# Patient Record
Sex: Male | Born: 1935 | Race: White | Hispanic: No | Smoking: Current every day smoker
Health system: Southern US, Community
[De-identification: ages and names within clinical notes are randomized; demographics above are authoritative.]

## PROBLEM LIST (undated history)

## (undated) DIAGNOSIS — I714 Abdominal aortic aneurysm, without rupture, unspecified: Secondary | ICD-10-CM

## (undated) DIAGNOSIS — Z9889 Other specified postprocedural states: Secondary | ICD-10-CM

## (undated) DIAGNOSIS — I10 Essential (primary) hypertension: Secondary | ICD-10-CM

## (undated) DIAGNOSIS — C649 Malignant neoplasm of unspecified kidney, except renal pelvis: Secondary | ICD-10-CM

## (undated) DIAGNOSIS — J449 Chronic obstructive pulmonary disease, unspecified: Secondary | ICD-10-CM

## (undated) DIAGNOSIS — I679 Cerebrovascular disease, unspecified: Secondary | ICD-10-CM

## (undated) DIAGNOSIS — I2510 Atherosclerotic heart disease of native coronary artery without angina pectoris: Secondary | ICD-10-CM

## (undated) DIAGNOSIS — I724 Aneurysm of artery of lower extremity: Secondary | ICD-10-CM

## (undated) DIAGNOSIS — I739 Peripheral vascular disease, unspecified: Secondary | ICD-10-CM

## (undated) DIAGNOSIS — E785 Hyperlipidemia, unspecified: Secondary | ICD-10-CM

## (undated) HISTORY — DX: Other specified postprocedural states: Z98.890

## (undated) HISTORY — DX: Atherosclerotic heart disease of native coronary artery without angina pectoris: I25.10

## (undated) HISTORY — DX: Essential (primary) hypertension: I10

## (undated) HISTORY — PX: OTHER SURGICAL HISTORY: SHX169

## (undated) HISTORY — DX: Chronic obstructive pulmonary disease, unspecified: J44.9

## (undated) HISTORY — DX: Cerebrovascular disease, unspecified: I67.9

## (undated) HISTORY — DX: Hyperlipidemia, unspecified: E78.5

## (undated) HISTORY — DX: Peripheral vascular disease, unspecified: I73.9

## (undated) HISTORY — DX: Malignant neoplasm of unspecified kidney, except renal pelvis: C64.9

---

## 1998-10-03 HISTORY — PX: ABDOMINAL AORTIC ANEURYSM REPAIR: SUR1152

## 1999-08-16 ENCOUNTER — Encounter: Payer: Self-pay | Admitting: *Deleted

## 1999-08-17 ENCOUNTER — Ambulatory Visit: Admission: RE | Admit: 1999-08-17 | Discharge: 1999-08-17 | Payer: Self-pay | Admitting: *Deleted

## 1999-08-20 ENCOUNTER — Ambulatory Visit (HOSPITAL_COMMUNITY): Admission: RE | Admit: 1999-08-20 | Discharge: 1999-08-20 | Payer: Self-pay | Admitting: *Deleted

## 1999-08-20 ENCOUNTER — Encounter: Payer: Self-pay | Admitting: *Deleted

## 1999-09-13 ENCOUNTER — Encounter: Payer: Self-pay | Admitting: *Deleted

## 1999-09-13 ENCOUNTER — Inpatient Hospital Stay (HOSPITAL_COMMUNITY): Admission: RE | Admit: 1999-09-13 | Discharge: 1999-09-21 | Payer: Self-pay | Admitting: *Deleted

## 1999-09-14 ENCOUNTER — Encounter: Payer: Self-pay | Admitting: *Deleted

## 1999-09-16 ENCOUNTER — Encounter: Payer: Self-pay | Admitting: *Deleted

## 1999-09-20 ENCOUNTER — Encounter: Payer: Self-pay | Admitting: *Deleted

## 2005-02-10 ENCOUNTER — Ambulatory Visit: Payer: Self-pay | Admitting: Oncology

## 2005-07-29 ENCOUNTER — Ambulatory Visit: Payer: Self-pay | Admitting: Oncology

## 2005-11-04 ENCOUNTER — Ambulatory Visit: Payer: Self-pay | Admitting: Oncology

## 2006-01-03 ENCOUNTER — Observation Stay (HOSPITAL_COMMUNITY): Admission: EM | Admit: 2006-01-03 | Discharge: 2006-01-04 | Payer: Self-pay | Admitting: Emergency Medicine

## 2006-01-03 ENCOUNTER — Ambulatory Visit: Payer: Self-pay | Admitting: *Deleted

## 2006-01-09 ENCOUNTER — Ambulatory Visit: Payer: Self-pay

## 2006-01-09 ENCOUNTER — Ambulatory Visit: Payer: Self-pay | Admitting: Cardiology

## 2006-02-15 ENCOUNTER — Ambulatory Visit: Payer: Self-pay | Admitting: Cardiology

## 2006-03-07 ENCOUNTER — Ambulatory Visit: Payer: Self-pay

## 2006-04-12 ENCOUNTER — Ambulatory Visit: Payer: Self-pay | Admitting: Cardiology

## 2006-04-21 ENCOUNTER — Ambulatory Visit: Payer: Self-pay | Admitting: Oncology

## 2006-05-04 ENCOUNTER — Ambulatory Visit: Payer: Self-pay | Admitting: Cardiology

## 2006-08-14 ENCOUNTER — Ambulatory Visit: Payer: Self-pay | Admitting: Cardiology

## 2006-10-09 ENCOUNTER — Ambulatory Visit: Payer: Self-pay | Admitting: Oncology

## 2006-12-19 ENCOUNTER — Ambulatory Visit: Payer: Self-pay | Admitting: Cardiology

## 2007-03-20 ENCOUNTER — Ambulatory Visit: Payer: Self-pay | Admitting: Cardiology

## 2007-03-20 ENCOUNTER — Ambulatory Visit: Payer: Self-pay

## 2007-03-26 ENCOUNTER — Ambulatory Visit (HOSPITAL_COMMUNITY): Admission: RE | Admit: 2007-03-26 | Discharge: 2007-03-26 | Payer: Self-pay | Admitting: Cardiology

## 2007-03-26 ENCOUNTER — Ambulatory Visit: Payer: Self-pay | Admitting: Cardiology

## 2007-04-02 ENCOUNTER — Ambulatory Visit: Payer: Self-pay | Admitting: Cardiology

## 2007-07-11 ENCOUNTER — Ambulatory Visit: Payer: Self-pay | Admitting: Cardiology

## 2007-09-19 ENCOUNTER — Ambulatory Visit: Payer: Self-pay | Admitting: Cardiology

## 2008-01-09 ENCOUNTER — Ambulatory Visit: Payer: Self-pay | Admitting: Cardiovascular Disease

## 2008-01-22 ENCOUNTER — Ambulatory Visit: Payer: Self-pay | Admitting: Cardiology

## 2008-04-15 ENCOUNTER — Ambulatory Visit: Payer: Self-pay

## 2008-04-15 ENCOUNTER — Ambulatory Visit: Payer: Self-pay | Admitting: Cardiology

## 2008-10-10 ENCOUNTER — Ambulatory Visit: Payer: Self-pay | Admitting: Cardiology

## 2008-10-10 ENCOUNTER — Ambulatory Visit: Payer: Self-pay

## 2009-04-25 DIAGNOSIS — I10 Essential (primary) hypertension: Secondary | ICD-10-CM | POA: Insufficient documentation

## 2009-04-25 DIAGNOSIS — E785 Hyperlipidemia, unspecified: Secondary | ICD-10-CM | POA: Insufficient documentation

## 2009-04-25 DIAGNOSIS — Z9889 Other specified postprocedural states: Secondary | ICD-10-CM

## 2009-04-25 DIAGNOSIS — J441 Chronic obstructive pulmonary disease with (acute) exacerbation: Secondary | ICD-10-CM

## 2009-04-25 DIAGNOSIS — I2510 Atherosclerotic heart disease of native coronary artery without angina pectoris: Secondary | ICD-10-CM

## 2009-04-25 DIAGNOSIS — F17200 Nicotine dependence, unspecified, uncomplicated: Secondary | ICD-10-CM

## 2009-04-25 DIAGNOSIS — C649 Malignant neoplasm of unspecified kidney, except renal pelvis: Secondary | ICD-10-CM | POA: Insufficient documentation

## 2009-04-25 DIAGNOSIS — I739 Peripheral vascular disease, unspecified: Secondary | ICD-10-CM

## 2009-04-25 DIAGNOSIS — I679 Cerebrovascular disease, unspecified: Secondary | ICD-10-CM

## 2009-05-01 ENCOUNTER — Ambulatory Visit: Payer: Self-pay

## 2009-05-01 ENCOUNTER — Ambulatory Visit: Payer: Self-pay | Admitting: Cardiology

## 2009-05-04 ENCOUNTER — Encounter: Payer: Self-pay | Admitting: Cardiology

## 2009-08-14 ENCOUNTER — Encounter (INDEPENDENT_AMBULATORY_CARE_PROVIDER_SITE_OTHER): Payer: Self-pay | Admitting: *Deleted

## 2009-11-11 ENCOUNTER — Ambulatory Visit: Payer: Self-pay | Admitting: Cardiology

## 2009-11-11 DIAGNOSIS — R55 Syncope and collapse: Secondary | ICD-10-CM

## 2010-01-19 ENCOUNTER — Telehealth: Payer: Self-pay | Admitting: Cardiology

## 2010-05-06 ENCOUNTER — Encounter: Payer: Self-pay | Admitting: Cardiology

## 2010-05-07 ENCOUNTER — Ambulatory Visit: Payer: Self-pay

## 2010-05-07 ENCOUNTER — Ambulatory Visit: Payer: Self-pay | Admitting: Cardiology

## 2010-05-07 ENCOUNTER — Encounter: Payer: Self-pay | Admitting: Cardiovascular Disease

## 2010-07-26 ENCOUNTER — Telehealth: Payer: Self-pay | Admitting: Cardiology

## 2010-11-04 NOTE — Miscellaneous (Signed)
Summary: Orders Update  Clinical Lists Changes  Orders: Added new Test order of Carotid Duplex (Carotid Duplex) - Signed 

## 2010-11-04 NOTE — Progress Notes (Signed)
Summary: pt trying to stop smoking  Phone Note Call from Patient Call back at Home Phone [phone]   Caller: Daughter/ Karin Golden Summary of Call: Pt daughter calling to talk with someone regarding her father and something that can help the pt stop smoking Initial call taken by: Judie Grieve,  January 19, 2010 3:52 PM  Follow-up for Phone Call        Pt quit smoking yesterday.  The pt is going to try and quit "cold [place]".  The pt's daughter is concerned because the pt may not be able to handle not smoking.  I spoke with her about prescription Chantix or OTC nicotine patches.  I did make her aware that Dr [name] does not prescribe anxiety medications and this would have to be discussed with the PCP.  She will speak with the pt and call the office if she has any other questions or concerns.   Follow-up by: Julieta Gutting, RN, BSN,  January 20, 2010 2:16 PM

## 2010-11-04 NOTE — Assessment & Plan Note (Signed)
Summary: f62m   Visit Type:  6 months follow up Primary Provider:  kinlaw  CC:  No complains.  History of Present Illness: Been doing pretty well.  He has trouble sleeping.  He has only had one spell of dizziness.  Doing better from a hydration standpoint.  No chest pain.  Patient has continued to smoke at this point.     Current Medications (verified): 1)  Multivitamins  Tabs (Multiple Vitamin) 2)  Aspirin 81 Mg Tbec (Aspirin) .... Take 1 Tablet By Mouth Two Times A Day 3)  Fish Oil 1000 Mg Caps (Omega-3 Fatty Acids) .... Take 1 Capsule By Mouth Two Times A Day 4)  Amitriptyline Hcl 25 Mg Tabs (Amitriptyline Hcl) .... Take 1 Tab By Mouth At Bedtime 5)  Probiotic  Caps (Misc Intestinal Flora Regulat) .... Take 1 Capsule By Mouth Once A Day 6)  Simvastatin 40 Mg Tabs (Simvastatin) .... Take One Tablet By Mouth Daily At Bedtime 7)  Bilberry 80 Mg Caps (Bilberry (Vaccinium Myrtillus)) .... Take 1 Capsule By Mouth Once A Day 8)  Amlodipine Besylate 5 Mg Tabs (Amlodipine Besylate) .... Take 1 1/2 Tablet Every Other Day 9)  Ambien 5 Mg Tabs (Zolpidem Tartrate) .... As Needed  Allergies (verified): No Known Drug Allergies  Vital Signs:  Patient profile:   77 year old male Height:      71 inches Weight:      139.50 pounds BMI:     19.53 Pulse rate:   62 / minute Pulse rhythm:   regular Resp:     18 per minute BP sitting:   164 / 86  (left arm) Cuff size:   regular  Vitals Entered By: Vikki Ports (November 11, 2009 11:50 AM)  Physical Exam  General:  Well developed, well nourished, in no acute distress. Neck:  Bilateral Carotid bruits, R greater than L. Chest Wall:  Thin Lungs:  Decreased breath sounds and prolonged expiration. Heart:  PMI non displaced.  Normal S1 and S2.  Pos S4 gallop.  No definite murmur.   Abdomen:  Bowel sounds positive; abdomen soft and non-tender without masses, organomegaly, or hernias noted. No hepatosplenomegaly. Neurologic:  Alert and oriented x  3.   EKG  Procedure date:  11/11/2009  Findings:      Minor nonspecific ST abnormality  Impression & Recommendations:  Problem # 1:  CAD (ICD-414.00) Stable sympotms.  Doubt he can stop smoking, but reinforced with him. Daughter understands situation well. His updated medication list for this problem includes:    Aspirin 81 Mg Tbec (Aspirin) .Marland Kitchen... Take 1 tablet by mouth two times a day    Amlodipine Besylate 5 Mg Tabs (Amlodipine besylate) .Marland Kitchen... Take 1 1/2 tablet every other day  Orders: EKG w/ Interpretation (93000)  Problem # 2:  HYPERTENSION (ICD-401.9) Remains elevated, but is better controlled at home.   His updated medication list for this problem includes:    Aspirin 81 Mg Tbec (Aspirin) .Marland Kitchen... Take 1 tablet by mouth two times a day    Amlodipine Besylate 5 Mg Tabs (Amlodipine besylate) .Marland Kitchen... Take 1 1/2 tablet every other day  Orders: EKG w/ Interpretation (93000)  Problem # 3:  ABDOMINAL AORTIC ANEURYSM REPAIR, HX OF (ICD-V15.1) Stable  Problem # 4:  HYPERLIPIDEMIA (ICD-272.4) Remains on lipid lowering. His updated medication list for this problem includes:    Simvastatin 40 Mg Tabs (Simvastatin) .Marland Kitchen... Take one tablet by mouth daily at bedtime  Problem # 5:  SYNCOPE AND COLLAPSE (ICD-780.2)  Reminded about need for hydration.  Habits have changed, and he has better understanding about potential for neurally mediated syncope.  Reinforced need for hydration, avoidance of caffeinated beverages, etc.    Patient Instructions: 1)  Your physician wants you to follow-up in:  6 MONTHS. You will receive a reminder letter in the mail two months in advance. If you don't receive a letter, please call our office to schedule the follow-up appointment. 2)  Your physician has requested that you have a carotid duplex in JULY 2011. This test is an ultrasound of the carotid arteries in your neck. It looks at blood flow through these arteries that supply the brain with blood. Allow one  hour for this exam. There are no restrictions or special instructions.

## 2010-11-04 NOTE — Assessment & Plan Note (Signed)
Summary: per check out/sf   Visit Type:  Follow-up Primary Provider:  kinlaw  CC:  No complains.  History of Present Illness: Patient continues to smoke about one pack per day.  Denies any chest pain.  Still doing some things.    Current Medications (verified): 1)  Multivitamins  Tabs (Multiple Vitamin) 2)  Aspirin 81 Mg Tbec (Aspirin) .... Take 1 Tablet By Mouth Two Times A Day 3)  Fish Oil 1000 Mg Caps (Omega-3 Fatty Acids) .... Take 1 Capsule By Mouth Two Times A Day 4)  Amitriptyline Hcl 25 Mg Tabs (Amitriptyline Hcl) .... Take 1 Tab By Mouth At Bedtime 5)  Probiotic  Caps (Misc Intestinal Flora Regulat) .... Take 1 Capsule By Mouth Once A Day 6)  Simvastatin 40 Mg Tabs (Simvastatin) .... Take One Tablet By Mouth Daily At Bedtime 7)  Bilberry 80 Mg Caps (Bilberry (Vaccinium Myrtillus)) .... Take 1 Capsule By Mouth Once A Day 8)  Amlodipine Besylate 5 Mg Tabs (Amlodipine Besylate) .... Take 1 1/2 Tablet Every Other Day 9)  Ambien 5 Mg Tabs (Zolpidem Tartrate) .... As Needed  Allergies (verified): No Known Drug Allergies  Past History:  Past Medical History: Last updated: 04/25/2009 Current Problems:  CAD (ICD-414.00) PERIPHERAL VASCULAR DISEASE (ICD-443.9) HYPERTENSION (ICD-401.9) HYPERLIPIDEMIA (ICD-272.4) ABDOMINAL AORTIC ANEURYSM REPAIR, HX OF (ICD-V15.1) CEREBROVASCULAR DISEASE (ICD-437.9) RENAL CELL CANCER (ICD-189.0) TOBACCO ABUSE (ICD-305.1) COPD (ICD-496)  Past Surgical History: Last updated: 04/25/2009  nephrectomy  AAA repair in 2000.  Family History: Last updated: 04/25/2009  Remarkable for mother who died of cancer and a strong   family history of cardiac disease.  His father died of a heart attack   and complications of cancer.  He had a brother who died of an MI at 47.   He has another brother who I have taken care of who has had significant  cardiac issues in the past.      Social History: Last updated: 04/25/2009   The patient lives in  [place] and is retired.  He   has a long smoking history and has not quit.   Vital Signs:  Patient profile:   77 year old male Height:      71 inches Weight:      138 pounds BMI:     19.32 Pulse rate:   61 / minute Pulse rhythm:   regular Resp:     18 per minute BP sitting:   144 / 78  (left arm) Cuff size:   regular  Vitals Entered By: Vikki Ports (May 07, 2010 1:43 PM)  Physical Exam  General:  Well developed, well nourished, in no acute distress. Head:  normocephalic and atraumatic Eyes:  PERRLA/EOM intact; conjunctiva and lids normal. Lungs:  Decrease breath sounds bilateral.  Slight decrease in left base.   Heart:  PMI non displaced.  Normal S1 and S2. Abdomen:  Bowel sounds positive; abdomen soft and palpable aorta.  Msk:  Back normal, normal gait. Muscle strength and tone normal. Neurologic:  Alert and oriented x 3.   EKG  Procedure date:  05/07/2010  Findings:      NSR. LVH.  No acute changes.  Impression & Recommendations:  Problem # 1:  CAD (ICD-414.00) No recurrent symptoms.  High risk for ACS, and he understands.  His updated medication list for this problem includes:    Aspirin 81 Mg Tbec (Aspirin) .Marland Kitchen... Take 1 tablet by mouth two times a day    Amlodipine Besylate 5 Mg Tabs (Amlodipine  besylate) .Marland Kitchen... Take 1 1/2 tablet every other day  His updated medication list for this problem includes:    Aspirin 81 Mg Tbec (Aspirin) .Marland Kitchen... Take 1 tablet by mouth two times a day    Amlodipine Besylate 5 Mg Tabs (Amlodipine besylate) .Marland Kitchen... Take 1 1/2 tablet every other day  Problem # 2:  HYPERTENSION (ICD-401.9)  controlled. His updated medication list for this problem includes:    Aspirin 81 Mg Tbec (Aspirin) .Marland Kitchen... Take 1 tablet by mouth two times a day    Amlodipine Besylate 5 Mg Tabs (Amlodipine besylate) .Marland Kitchen... Take 1 1/2 tablet every other day  His updated medication list for this problem includes:    Aspirin 81 Mg Tbec (Aspirin) .Marland Kitchen... Take 1  tablet by mouth two times a day    Amlodipine Besylate 5 Mg Tabs (Amlodipine besylate) .Marland Kitchen... Take 1 1/2 tablet every other day  Problem # 3:  HYPERLIPIDEMIA (ICD-272.4) Reduce dose to 20mg  given interaction with amlodipine.  See recommendations.  His updated medication list for this problem includes:    Simvastatin 40 Mg Tabs (Simvastatin) .Marland Kitchen... Take 1/2 tablet every night  Problem # 4:  ABDOMINAL AORTIC ANEURYSM REPAIR, HX OF (ICD-V15.1)  Problem # 5:  TOBACCO ABUSE (ICD-305.1) will not stop.  Patient Instructions: 1)  Your physician recommends that you schedule a follow-up appointment in: 6 months 2)  Your physician has recommended you make the following change in your medication: decrease simvastation to 20mg  qd

## 2010-11-04 NOTE — Progress Notes (Signed)
Summary: son calling re medication  Phone Note Call from Patient   Caller: Son ace [phone] Reason for Call: Talk to Nurse Summary of Call: pt's son calling re medication Initial call taken by: Glynda Jaeger,  July 26, 2010 8:35 AM  Follow-up for Phone Call        I spoke with the pt's son and he said the pt has a girlfriend now and would like to know if he can use Viagra.  The pt does not have a prescription for this medication but does have a family member who was going to give him some of their supply.  The pt's son did not want the pt to take this until Dr [name] gave him the okay.  The pt does not want his daughter to know about this conversation. I will speak with Dr [name] for further recommendations.    Follow-up by: Julieta Gutting, RN, BSN,  July 26, 2010 11:55 AM  Additional Follow-up for Phone Call Additional follow up Details #1::        Dr [name] is not in favor of ED drug. If the pt would like to discuss this issue then the pt can schedule an OV.  (Left main disease) Julieta Gutting, RN, BSN  July 26, 2010 6:07 PM  The pt's son is aware and will call back if the pt would like an appt. Additional Follow-up by: Julieta Gutting, RN, BSN,  July 26, 2010 6:33 PM

## 2010-11-10 ENCOUNTER — Encounter: Payer: Self-pay | Admitting: Cardiology

## 2010-11-10 ENCOUNTER — Ambulatory Visit (INDEPENDENT_AMBULATORY_CARE_PROVIDER_SITE_OTHER): Payer: MEDICARE | Admitting: Cardiology

## 2010-11-10 DIAGNOSIS — I10 Essential (primary) hypertension: Secondary | ICD-10-CM

## 2010-11-10 DIAGNOSIS — I679 Cerebrovascular disease, unspecified: Secondary | ICD-10-CM

## 2010-11-10 DIAGNOSIS — I2510 Atherosclerotic heart disease of native coronary artery without angina pectoris: Secondary | ICD-10-CM

## 2010-11-10 DIAGNOSIS — F17200 Nicotine dependence, unspecified, uncomplicated: Secondary | ICD-10-CM

## 2010-11-18 NOTE — Assessment & Plan Note (Signed)
Summary: f/u 6 months   Visit Type:  6 months follow up Primary Provider:  kinlaw  CC:  No cardiac complaints.  History of Present Illness: Patient is doing well overall.  No major complaints.  He denies dizziness and denies any chest pain.  He continues to smoke about six cigarettes per day.  Otherwise doing well without major symptoms.    Problems Prior to Update: 1)  Syncope and Collapse  (ICD-780.2) 2)  Cad  (ICD-414.00) 3)  Peripheral Vascular Disease  (ICD-443.9) 4)  Hypertension  (ICD-401.9) 5)  Hyperlipidemia  (ICD-272.4) 6)  Abdominal Aortic Aneurysm Repair, Hx of  (ICD-V15.1) 7)  Cerebrovascular Disease  (ICD-437.9) 8)  Renal Cell Cancer  (ICD-189.0) 9)  Tobacco Abuse  (ICD-305.1) 10)  COPD  (ICD-496)  Current Medications (verified): 1)  Multivitamins  Tabs (Multiple Vitamin) 2)  Aspirin 81 Mg Tbec (Aspirin) .... Take 1 Tablet By Mouth Two Times A Day 3)  Fish Oil 1000 Mg Caps (Omega-3 Fatty Acids) .... Take 1 Capsule By Mouth Two Times A Day 4)  Amitriptyline Hcl 25 Mg Tabs (Amitriptyline Hcl) .... Take 1 Tab By Mouth At Bedtime 5)  Probiotic  Caps (Misc Intestinal Flora Regulat) .... Take 1 Capsule By Mouth Once A Day 6)  Simvastatin 40 Mg Tabs (Simvastatin) .... Take 1/2 Tablet Every Night 7)  Bilberry 80 Mg Caps (Bilberry (Vaccinium Myrtillus)) .... Take 1 Capsule By Mouth Once A Day 8)  Amlodipine Besylate 5 Mg Tabs (Amlodipine Besylate) .... Take 1 1/2 Tablet Every Day 9)  Ambien 5 Mg Tabs (Zolpidem Tartrate) .... As Needed 10)  Eye Vitamins  Caps (Multiple Vitamins-Minerals) .... Take 1 Capsule By Mouth Two Times A Day 11)  Red Yeast Rice 600 Mg Tabs (Red Yeast Rice Extract) .... Take 1 Tablet By Mouth Once A Day  Allergies (verified): No Known Drug Allergies  Vital Signs:  Patient profile:   77 year old male Height:      71 inches Weight:      138.75 pounds BMI:     19.42 Pulse rate:   62 / minute Pulse rhythm:   irregular Resp:     18 per minute BP  sitting:   124 / 64  (left arm) Cuff size:   large  Vitals Entered By: Vikki Ports (November 10, 2010 9:21 AM)  Physical Exam  General:  Thin elderly gentleman in no distress Head:  normocephalic and atraumatic Eyes:  deep set. Neck:  Soft bilateral bruits.  Lungs:  Decreased breath sounds without rales.  Heart:  PMI non displaced.  Normal S1 and S2.  No murmur or rub, or gallop at present.  Abdomen:  Bowel sounds positive; abdomen soft and non-tender without masses, organomegaly, or hernias noted. No hepatosplenomegaly.  Prior scar Extremities:  Slight decrease in pulses bilaterally. Neurologic:  Alert and oriented x 3.   EKG  Procedure date:  11/10/2010  Findings:      NSR.  Nonspecific ST abnormality.  Impression & Recommendations:  Problem # 1:  CAD (ICD-414.00) Continues to remain stable.  No major issues at present.  No chest pain. His updated medication list for this problem includes:    Aspirin 81 Mg Tbec (Aspirin) .Marland Kitchen... Take 1 tablet by mouth two times a day    Amlodipine Besylate 5 Mg Tabs (Amlodipine besylate) .Marland Kitchen... Take 1 1/2 tablet every day  Orders: EKG w/ Interpretation (93000)  Problem # 2:  HYPERTENSION (ICD-401.9) well controlled on amlodipine His updated medication list  for this problem includes:    Aspirin 81 Mg Tbec (Aspirin) .Marland Kitchen... Take 1 tablet by mouth two times a day    Amlodipine Besylate 5 Mg Tabs (Amlodipine besylate) .Marland Kitchen... Take 1 1/2 tablet every day  Orders: EKG w/ Interpretation (93000)  Problem # 3:  CEREBROVASCULAR DISEASE (ICD-437.9) No more symptoms.  Reminded him to stay well hydrated.  Needs followup carotid in a few months.  Problem # 4:  HYPERLIPIDEMIA (ICD-272.4) tolerating medication. His updated medication list for this problem includes:    Simvastatin 40 Mg Tabs (Simvastatin) .Marland Kitchen... Take 1/2 tablet every night  Orders: EKG w/ Interpretation (93000)  Problem # 5:  TOBACCO ABUSE (ICD-305.1) Has not stopped smoking.  At  25, I doubt that he will.  He is comfortable with his decision.  Problem # 6:  COPD (ICD-496) evident on exam.  Patient Instructions: 1)  Your physician recommends that you continue on your current medications as directed. Please refer to the Current Medication list given to you today. 2)  Your physician wants you to follow-up in: 6 MONTHS.  You will receive a reminder letter in the mail two months in advance. If you don't receive a letter, please call our office to schedule the follow-up appointment.

## 2011-02-14 ENCOUNTER — Other Ambulatory Visit: Payer: Self-pay | Admitting: Cardiology

## 2011-02-15 NOTE — Assessment & Plan Note (Signed)
[facility]                            CARDIOLOGY OFFICE NOTE   Craig, Berry                     MRN:          [id number]  DATE:03/20/2007                            DOB:          04/28/1926    [name] is in for followup.  A lot of the dizziness that he was  experiencing no longer is experienced.  He underwent carotid studies  today which demonstrate some advancement in disease.  He now has a 60%  to 79% RICA stenosis.  Of concern, he has been having episodes of chest  discomfort and they occur up to 2 times a week and last 15-20 minutes.  They have awoken him from sleep.  He does unfortunately continue to  smoke.   MEDICATIONS:  1. Amitriptyline 25 mg nightly.  2. Simvastatin 20 mg daily.  3. Fish oil 1000 mg daily.  4. Glucosamine 150 mg.  5. Aspirin 81 mg b.i.d.  6. Multivitamin daily.   PHYSICAL EXAMINATION:  The blood pressure is 164/84 with a pulse of 64.  The lung fields reveal decreased breath sounds with prolonged  aspiration.  CARDIAC:  Rhythm is regular and I do not appreciate a significant  murmur.  His abdominal aortic aneurysm site is well-healed.  He has  bifemoral bruits in the locations of where he has incisions from his  aortobifem bypass.   Finally, his electrocardiogram today demonstrates normal sinus,  essentially within normal limits.   His carotid Dopplers demonstrate a 60% to 79% RICA stenosis and 0-39 on  the left.  At the present time, I have recommended that we consider  cardiac catheterization.  His last study was in 1996; at that time, he  had 30% to 40% distal left main disease, and his overall preventive  measures have been largely inadequate.  I am worried about his current  symptomatology and we even talked about possibly doing his  catheterization the day after tomorrow; he wants to defer until next  week.  We will get him to make sure he takes his aspirin.  We will give  him a prescription for  nitroglycerin if he has a severe episode of pain  and I have instructed him to come to the emergency room if he were to  have any serious episodes.  We will plan cardiac catheterization next  week.     Craig Berry. [name], MD, [facility]  Electronically Signed    TDS/MedQ  DD: 03/20/2007  DT: 03/21/2007  Job #: [id number]

## 2011-02-15 NOTE — Assessment & Plan Note (Signed)
[facility]                            CARDIOLOGY OFFICE NOTE   ASHAWN, RINEHART                     MRN:          [id number]  DATE:10/10/2008                            DOB:          December 25, 1925    Mr. Deupree is in today for followup visit.  To briefly summarize, he  remains stable.  Since I last saw him, he has had no more spells.  He  does unfortunately continue to smoke about half a pack of cigarettes a  day, and as per previous office visits, we had a thorough discussion  about this some detail.  He is an 77 years old, had had multiple CTs.  He is not likely to quit and his daughter is clearly aware of all of  this.  The patient has a history of left nephrectomy for renal cell  cancer and tiny nodules previously seen on chest CT.  Repeat CT was done  in November by Dr. Gilman Buttner.  This demonstrated no evidence for  metastatic disease with resolution of the previously seen nodules.  There is severe bullous emphysema with no acute abnormality and  gallstones.  There is a ventral hernia, an aneurysmal change, and the  abdominal aorta with a maximum diameter of 3.4 cm2.  His most recent  laboratory studies have also revealed a white count of 14,300 and  platelet count of 542,000.  These observations have been previously  noted over some time and are being followed by Dr. Gilman Buttner.   MEDICATIONS:  1. Multivitamin daily.  2. Aspirin 81 mg b.i.d.  3. Fish oil 1000 mg daily.  4. Amitriptyline 25 nightly.  5. Amlodipine 5 mg daily.  6. Probiotic daily.  7. Citrucel daily.  8. Simvastatin 40 mg nightly.   On physical, he is alert and oriented in no distress. Blood pressure  140/80 and the pulse is 61.  There is decreased breath sounds  bilaterally.  Heart sounds are somewhat distant, has been previously  noted.   Carotid data reveals moderate right carotid stenosis with 60-79% by  Doppler analysis, this is unchanged, and 0-39, the left  internal  carotid.   Electrocardiogram demonstrates normal sinus rhythm with minimal voltage  criteria for left ventricular hypertrophy, otherwise unremarkable.   IMPRESSION:  1. Well-described coronary artery disease.  2. Advanced age.  3. Chronic tobacco use with defined emphysema.  4. Status post abdominal aortic aneurysm repair in 2000 with residual      findings of 3.4 cm abnormality.  5. Cerebrovascular disease.  6. Hyperlipidemia.  7. History of left nephrectomy, status post renal cell cancer in 1986.   RECOMMENDATIONS:  1. I will recommend discontinuation of tobacco.  2. Continue to follow up with Cardiology clinic in 6 months.  3. He will need to have followup of his abdominal aneurysm and its      location.  I have encouraged him to see Vascular Surgery.  4. He should have followup lipid profile with Dr. Artis Flock.     [name]. [name], MD, [facility]  Electronically Signed    TDS/MedQ  DD: 10/13/2008  DT: 10/13/2008  Job #: [id number]   cc:   Dellia Beckwith, M.D.  Quita Skye Artis Flock, M.D.

## 2011-02-15 NOTE — Assessment & Plan Note (Signed)
[facility]                            CARDIOLOGY OFFICE NOTE   Craig Berry, Craig Berry                     MRN:          [id number]  DATE:01/09/2008                            DOB:          11-01-1925    Craig Berry is a very pleasant 77 year old white male patient of Dr. Bonnee Quin who has history of coronary artery disease, since his last cath  in June 2008, with nonobstructive lesions, not amendable to bypass or  PCI.  Medical therapy recommended.  He also has a history of  aortobifemoral bypass grafting, hypertension and history of syncope.  He  was in [facility] last week with another syncopal episode.  He  was prepping for a colonoscopy and while taking a shower, became very  dizzy and fell.  He says he never totally went out, but he was so dizzy  he could not get up to make a phone call to his daughter for about 30  minutes.  He went to the hospital.  They did a CT of his head, chest and  blood work, which looked pretty good.  They wanted to admit him but he  refused admission and said he would follow up with Dr. [name]. They  felt he was dehydrated as well.  The patient admits to not eating or  drinking properly.  He never drinks water and has to force himself to  eat because he is never hungry.  His daughter states that most of these  episodes of syncope in the past have been related to dehydration.   CURRENT MEDICATIONS:  1. Multivitamin daily.  2. Aspirin 81 mg b.i.d..  3. Glucosamine 150 mg daily.  4. Fish oil 1000 mg daily.  5. Simvastatin 20 mg nightly.  6. Amitriptyline 25 mg nightly.  7. Amlodipine 5 mg daily.  8. Probiotic daily.   PHYSICAL EXAMINATION:  GENERAL APPEARANCE:  This is a pleasant elderly  77 year old white male in no acute distress.  VITAL SIGNS:  Blood pressure, orthostatics were taken in the office.  He  was not orthostatic.  Lying with 182/90, standing 151/84.  He was not  dizzy.  Pulse was in the low  70s.  NECK:  Without JVD or HJR.  No carotid bruits.  LUNGS:  Clear anterior, posterior and lateral.  HEART:  Regular rate and rhythm at 70 beats per minute, normal S1 and S2  with a 1/6 systolic ejection murmur at the left sternal border.  Distant  heart sounds.  ABDOMEN:  Soft without organomegaly, masses, lesions or abnormal  tenderness.  EXTREMITIES:  Without cyanosis or edema with good distal pulses.   IMPRESSION:  1. Syncope with fall during colon prep, feel probably secondary to      dehydration.  2. History of syncope in the past.  No clear diagnosis ever made, but      usually related to dehydration situations.  3. Hypertension.  4. Coronary artery disease with 30-40% left main, heavily calcified      left anterior descending with 60% disease, 60% ramus, and 70-80% AV      circumflex  on catheterization in 2008.  5. Status post abdominal aortic aneurysm repair in 2000.  6. Hyperlipidemia.  7. Peripheral vascular disease.  8. Chronic obstructive pulmonary disease with continued ongoing      tobacco abuse.  9. History of left nephrectomy for renal cell cancer in 1986.   PLAN:  At this time, I suspect the patient's syncope and fall is  secondary to the colon prep and dehydration.  He is currently stable and  he has an appointment to see Dr. [name] back on January 22, 2008, which  we will keep.  His to call if he has any further problems.  I have  encouraged him to increase his fluid intake.      Jacolyn Reedy, PA-C  Electronically Signed      [name]. [name], MD, [facility]  Electronically Signed   ML/MedQ  DD: 01/09/2008  DT: 01/09/2008  Job #: [id number]

## 2011-02-15 NOTE — Assessment & Plan Note (Signed)
[facility]                            CARDIOLOGY OFFICE NOTE   Craig Berry, Craig Berry                     MRN:          [id number]  DATE:03/20/2007                            DOB:          05/12/1926    Craig Berry is in for followup.  Since I last saw him, he has had a  couple of episodes of chest discomfort.  This has occurred in the last  few days.  He says that it occurred in the center of the chest and  radiated toward the left shoulder.  He did get somewhat short of breath.  We talked about the possibility of going ahead with cardiac  catheterization this week, but he wanted to defer until early next week.  It has not been necessarily related to exertion and they have come on  rather spontaneously.  The patient last underwent cardiac  catheterization a number of years ago, last being in 1996, at which time  he had 30-40% left main disease, 30-40% LAD disease and 70% AV  circumflex.  He had 40% narrowing in the mid-right coronary and 30%  distal.  He has had an abdominal aortic aneurysm repair in the interim.   MEDICATIONS INCLUDE:  1. Multivitamin daily.  2. Aspirin 81 mg b.i.d.  3. Glucosamine 150 mg daily.  4. Fish oil 1000 mg daily.  5. Simvastatin 20 mg q.h.s.  6. Amitriptyline 25 q.h.s.   PHYSICAL:  The blood pressure is 164/84, the pulse is 64.  There is  prolonged expiration with decreased breath sounds bilaterally.  The  heart sounds are distant.  He has bifemoral bruits noted.  He has had an  abdominal aneurysm repair.   At the present time, I am concerned about his symptoms.  We talked about  doing a catheterization later this week, but he was in agreement that he  would have this done early next week.  He has had known left main  disease in the past.  I have discussed the risks, benefits, and  alternatives with him and he is agreeable to proceed.     [name]. [name], MD, [facility]  Electronically Signed    TDS/MedQ  DD:  03/22/2007  DT: 03/23/2007  Job #: [id number]

## 2011-02-15 NOTE — Assessment & Plan Note (Signed)
[facility]                            CARDIOLOGY OFFICE NOTE   Craig Berry, Craig Berry                     MRN:          [id number]  DATE:01/22/2008                            DOB:          10-23-1925    HISTORY:  Craig Berry is in for followup.  He is stable.  He has not  been having any ongoing chest pain or shortness of breath.  He had an  episode of syncope recently.  He had undergone a colonoscopy prep.  He  has seen in the emergency room and was thought to be moderately  dehydrated.  He has been seen back here in the clinic since that time.  Also of note, he ran out of his blood pressure medicine about a week ago  and has not been taking any of it.   CURRENT MEDICATIONS:  1. Multivitamin daily.  2. Aspirin 81 mg b.i.d.  3. Fish oil 1000 mg daily.  4. Simvastatin 20 mg q.h.s.  5. Amitriptyline 25 mg q.h.s.  6. Amlodipine 5 mg daily.  7. Probiotic 1 daily.   PHYSICAL EXAMINATION:  GENERAL:  He is alert and oriented.  VITAL SIGNS:  Blood pressure 170/100, pulse 64.  LUNGS:  Fields are clear.  CARDIAC:  Rhythm is regular.   IMPRESSION:  1. Recent syncope, probably related to dehydration.  2. History of syncope in the past, question neurally mediated.  3. Hypertension with poor control today because the patient is not      taking his medicines.  4. Coronary artery disease as described in recent cardiac      catheterization.  5. Status post abdominal aortic aneurysm repair in 2000.  6. Hyperlipidemia.  7. Peripheral vascular disease as demonstrated by cerebrovascular      disease.  8. Chronic obstructive pulmonary disease with continued ongoing      tobacco use.  9. History of left nephrectomy for renal cell cancer in 1986.   PLAN:  1. I have advised the patient to remain well hydrated.  2. He has fortunately abandoned caffeinated coffee for decaf most of      the time.  3. Resume his medications.  4. He will report any further  episodes.   FOLLOW UP:  He will be seen in 3 months.  He will need a carotid in 2  months.     [name]. [name], MD, [facility]  Electronically Signed    TDS/MedQ  DD: 01/22/2008  DT: 01/22/2008  Job #: [id number]

## 2011-02-15 NOTE — Assessment & Plan Note (Signed)
[facility]                            CARDIOLOGY OFFICE NOTE   KOURY, RODDY                     MRN:          [id number]  DATE:07/11/2007                            DOB:          Feb 09, 1926    Mr. Larrabee is in for a follow up visit. Yesterday, he went to the  emergency room. He became nauseated and was vomiting. He had dry heaves.  According to his daughter, he had a regular pulse. He did feel funny and  she went to get a wet cloth for his forehead. He became slightly worse  and he ended up going to the emergency room. In the emergency room, he  had a normal hemoglobin and hematocrit, but he had a [place]  409[address]. The patient had a portable chest x-ray done which revealed a  tortuous aorta, heart size top normal, and some diffuse increased lung  markings with questionable minimal nodularity. The sodium and potassium  were normal, as was the BUN and creatinine. Albumin was normal, as was  the total protein. Liver functions were normal as well. The patient was  given a little bit of Phenergan. He is somewhat better today. He is not  nauseated or having any chest pain. His intermittent dizziness does  continue.   MEDICATIONS:  1. Aspirin 81 mg daily.  2. Glucosamine.  3. Fish oil.  4. Simvastatin 20 mg nightly.  5. Amitriptyline 25 mg nightly.  6. Amlodipine 5 mg daily.   PHYSICAL EXAMINATION:  VITAL SIGNS:  Blood pressure 160/80 and then fell  to 140/80. Pulse 72.  LUNGS:  There were decreased breath sounds bilaterally compatible with  chronic obstructive pulmonary disease.   The electrocardiogram done in the emergency room yesterday was  essentially within normal limits.   The exact etiology of his spells remain unclear. I am not sure if this  was a primary event or something related to some sort of viral illness.  The patient is clinically better today. I have instructed him not to  drive. I have also encouraged him to stop  smoking which has been a  difficult problem over many years. He has extensive coronary disease. He  has had an extensive work up for spells and there is a question of  whether this is neurally mediated syncope, we have encouraged him to  remain well hydrated. He does drink caffeine in that he has switched  over to decaf coffee, but has drinking [facility] as well. I have  encouraged him to follow up with  Dr. Lawana Pai in the very near future, and we will see him back in follow  up in another month or two to see how he is doing.     [name]. [name], MD, [facility]  Electronically Signed    TDS/MedQ  DD: 07/15/2007  DT: 07/15/2007  Job #: [id number]

## 2011-02-15 NOTE — H&P (Signed)
NAME:  Craig Berry, Craig Berry NO.:  [id number]   MEDICAL RECORD NO.:  [id number]           PATIENT TYPE:   LOCATION:                                 FACILITY:   PHYSICIAN:  [name]. [name], MD, FACCDATE OF BIRTH:  28-Jan-1926   DATE OF ADMISSION:  DATE OF DISCHARGE:                              HISTORY & PHYSICAL   CHIEF COMPLAINT:  Chest pain.   HISTORY OF PRESENT ILLNESS:  The patient is a 77 year old gentleman well-  known to me.  He last underwent catheterization in 1996 at which time he  had scattered disease but including a 30-40% left main lesion.  He has  continued to smoke.  Over the past week he has had a couple of episodes  of worrisome-sounding chest pain.  It was substernal with radiation.  It  was not brought on with exertion.  He does have significant dyspnea of  effort, however, and has poorly treated chronic obstructive pulmonary  disease.  He was also in the hospital for an episode of chest pain in  April 2007, but we deferred catheterization at that time.  He is now  admitted for further evaluation.   PAST MEDICAL HISTORY:  1. Left nephrectomy for renal cell cancer in 1986.  2. Status post abdominal aortic aneurysm repair in 2000.  3. Hypertension.  4. Hyperlipidemia.  5. Peripheral vascular disease.  6. COPD.  7. Chronic smoking history.   SOCIAL HISTORY:  The patient lives in [place] and is retired.  He  has a long smoking history and has not quit.   FAMILY HISTORY:  Remarkable for mother who died of cancer and a strong  family history of cardiac disease.  His father died of a heart attack  and complications of cancer.  He had a brother who died of an MI at 21.  He has another brother who I have taken care of who has had significant  cardiac issues in the past.   MEDICATIONS:  1. Multivitamin daily.  2. Aspirin 81 mg b.i.d.  3. Glucosamine 150 mg daily.  4. Fish oil 1000 mg daily.  5. Simvastatin 20 mg q.h.s.  6. Amitriptyline  25 q.h.s.   REVIEW OF SYSTEMS:  No fevers, chills, sweats.  No headaches, change in  vision.  His dizziness has improved.   PHYSICAL EXAMINATION:  GENERAL:  He is alert, oriented gentleman.  He is  conversant.  VITAL SIGNS:  Blood pressure 164/84, pulse 64.  HEENT:  Normocephalic, atraumatic.  Extraocular muscles are intact.  Pupils are equal and reactive to light and accommodation.  NECK:  Supple with full range of motion.  He does not have carotid  bruits.  HEART:  He has normal first and second heart sounds without murmur, rub  or gallop.  LUNGS:  Decreased breath sounds bilaterally with prolonged expiration.  ABDOMEN:  Soft.   He has had a previous surgical procedure.  His incisions are healing.  There are bifemoral bruits.   IMPRESSION:  1. Recurrent substernal chest pain with prior known left main disease      in 2000 and  1996.  2. Continued smoking history.  3. Prior nephrectomy.  4. Other problems as listed.   PLAN:  Cardiac catheterization has been recommended.  Risks, benefits,  and alternatives have been discussed with the patient in detail.  He has  consented to proceed.      [name]. [name], MD, [facility]  Electronically Signed     TDS/MEDQ  D:  03/22/2007  T:  03/23/2007  Job:  [id number]

## 2011-02-15 NOTE — Assessment & Plan Note (Signed)
[facility]                            CARDIOLOGY OFFICE NOTE   Craig Berry, Craig Berry                     MRN:          [id number]  DATE:04/02/2007                            DOB:          1926-06-19    Craig Berry returned today in followup.  I had him undergo repeat  cardiac catheterization. This study does demonstrate fairly significant  underlying disease.  Importantly, I felt that likely this disease was  not severe enough to warrant coronary bypass surgery.  The left main had  30-40% narrowing, appeared relatively smooth.  The LAD was heavily  calcified, and there was up to 60% disease.  The ramus had 60% narrowing  before the bifurcation, and the AV circumflex had about a 70-80% mid  narrowing.  It, however, was small in caliber.  The right coronary  artery also had diffuse disease with multiple lesions, all of  intermediate severity.   PHYSICAL EXAMINATION:  GENERAL:  Alert and oriented.  VITAL SIGNS:  Blood pressure today was 170/100, and the pulse was 64.  LUNGS:  Lung fields are clear but decreased breath sounds bilaterally  compatible with COPD.  EXTREMITIES:  His groins are healed, and there are soft bruits  bilaterally which has not changed.   The patient is significantly hypertensive.  With regard to his  catheterization findings, I have not recommended revascularization  surgery at this point.  The vessels are small, and he has diffuse  disease but not critical disease.  Unfortunately, he continues to smoke  and so is at high risk for plaque rupture.  His blood pressure today is  in the 170/100 range, and I have taken the liberty of starting him on  amlodipine 5 mg daily.  The patient understands that this could cause  some side effects as he has not tolerated medicines well in the past.  I  plan to see him back in followup in the near future.  We will see how he  is doing.  My home is that he can continue to be treated medically,  but  it would help if he were to discontinue smoking for which he has no  interest.     Craig Morton. [name], MD, [facility]  Electronically Signed    TDS/MedQ  DD: 04/02/2007  DT: 04/02/2007  Job #: [id number]

## 2011-02-15 NOTE — Cardiovascular Report (Signed)
NAME:  Craig, Berry NO.:  [id number]   MEDICAL RECORD NO.:  [id number]          PATIENT TYPE:  OIB   LOCATION:  2856                         FACILITY:  [facility]   PHYSICIAN:  [name]. [name], MD, FACCDATE OF BIRTH:  [birth year]   DATE OF PROCEDURE:  03/26/2007  DATE OF DISCHARGE:                            CARDIAC CATHETERIZATION   INDICATIONS:  Craig Berry is a 77 year old gentleman well-known to me.  He has had previous aortobifemoral bypass graft surgery for abdominal  aortic aneurysm. In addition, he has known coronary artery disease with  his last catheterization in 1996.  He continues to smoke over many years  and has chronic bronchitis by exam.  He also had known distal left main  disease in 7.  We catheterized him in 1990, at that time he had  diffuse scattered coronary irregularities. Recently has had some  episodes of chest discomfort.  We saw him in the office.  We elected to  recommend proceeding on with cardiac catheterization at this time. The  patient has had a prior nephrectomy and the plan was to do a diagnostic  catheterization without ventriculography.  Risks, benefits were  discussed with the patient and he was agreeable to proceed.   PROCEDURE:  1. Left heart catheterization.  2. Selective coronary arteriography.   DESCRIPTION OF PROCEDURE:  The patient was brought to the  catheterization laboratory, prepped and draped in usual fashion. Through  an anterior puncture the femoral graft was entered.  Our stick was  slightly high to make sure that we entered the face of the  aortobifemoral graft.  This was easy cannulation and the 5-French sheath  was placed. After this multiple views of the left coronary artery were  obtained.  Following this we did right coronary arteriography.   CONTRAST:  50 mL total.  The ventriculography was not performed.  We did  measure pressures in the LV.  There were no complications.  I held the  groin  personally for 15 minutes and the technologist for an additional  10 minutes.  20 mg of intravenous hydralazine was given in three doses  over about 20 minutes because of continued elevation in blood pressure.  Adequate hemostasis was achieved and he was taken to the holding area in  satisfactory clinical condition.   Finally, I reviewed the films in detail with his daughter who I have  known over many years.  We discussed various treatment strategies for  the patient.  This will be discussed with the patient in detail.   HEMODYNAMIC DATA.:  1. Central aortic pressure 173/88, mean 120.  2. Left ventricular pressure 184/5.  3. There was no gradient on pullback across aortic valve.   ANGIOGRAPHIC DATA.:  1. Ventriculography was not performed as previously noted because of      prior nephrectomy.  2. On plain fluoroscopy there is moderate calcification of the      coronary arteries.  3. The left main coronary artery demonstrates diffuse luminal      irregularity.  There is segmental plaque of about 30%, but it is  clearly in excess of the diagnostic catheter in size.  The      diagnostic catheter is a 5-French. The distal portion of the left      main demonstrates about 40% to no more than 50% narrowing and      appears relatively smooth.  4. The left anterior descending artery is a fairly heavily calcified      vessel.  The left anterior descending artery courses to the apex      were wraps the apical tip.  In the midportion of the vessel, there      is diffuse segmental plaquing at the worst this perhaps might be up      to 60%.  This certainly does not appear to be critical.  At the      diagonal LAD bifurcation there is a large coronary aneurysm.  It is      at least moderate in size.  The LAD proper comes down to the apex.      In this segment there is diffuse 40-60% narrowing.  The diagonal      has also diffuse luminal irregularity of about 40% proximally.      However,  neither of the vessels appear to be critically narrow, and      the LAD wraps the apex of the diagonal, supplies the anterolateral      wall, both without apparent high-grade compromise.  5. There is a ramus intermedius that has perhaps 60% narrowing before      it bifurcates.  This is seen best in one-view. Following the      bifurcation the larger arm has about 40% narrowing.  6. The AV circumflex courses markedly posteriorly which would make      access difficult.  There is 70% narrowing proximally and 70-80% mid-      narrowing. It bifurcates distally. Most importantly, the vessel      appears to be smaller than the diagnostic catheter which is 1.7 mm      in size.  7. The right coronary artery is fairly heavily calcified proximally      and throughout the midvessel. Whole proximal vessel has diffuse 40%      narrowing followed by a mid focal eccentric 50% narrowing.  The      distal vessel opens up, and then divides into a posterior      descending, a second branch, and a posterolateral branch. The      posterior descending perhaps has about 40% segmental plaque and the      first posterolateral branch has about 60% focal narrowing.  The AV      portion of the distal right coronary artery has about 50-70% at the      ostium takeoff from the crux and then about 30% more distally      before it bifurcates.   CONCLUSION:  1. Advanced age.  2. Diffuse coronary abnormalities as described above.  3. Moderately high-grade carotid disease.  4. Prior nephrectomy with mild elevation in creatinine.   DISPOSITION:  Craig Berry continues to smoke and really does not have  any intention of quitting.  Goal would be significant risk factor  reduction.  Based on the current anatomy, it is not clear to me that he  would be benefited by a revascularization surgery unless he continues to  have increasing symptoms.  It would be difficult to justify percutaneous intervention because of the multiple  lesions and lack of clear-cut  culprit.  Also, none of the lesions may be hemodynamically significant  enough to support bypass so therefore aggressive medical management  would be recommended.  I will continue following him in the office  closely.      [name]. [name], MD, [facility]  Electronically Signed     TDS/MEDQ  D:  03/26/2007  T:  03/26/2007  Job:  [id number]

## 2011-02-15 NOTE — Assessment & Plan Note (Signed)
[facility]                            CARDIOLOGY OFFICE NOTE   MAHMOOD, BOEHRINGER                     MRN:          [id number]  DATE:04/15/2008                            DOB:          03-20-1926    Craig Berry is in for followup.  He is doing extremely well.  He has not  had any recurrent symptoms.  He does unfortunately continues to smoke  about a pack of cigarettes a day.  He brought me a picture today of  [name].   MEDICATIONS:  1. Multivitamins 1 daily.  2. Aspirin 81 mg b.i.d.  3. Fish oil 1000 mg daily.  4. Simvastatin 20 mg at bedtime.  5. Amitriptyline 25 mg at bedtime.  6. Amlodipine 5 mg daily.  7. Probiotic.  8. Citrucel.   PHYSICAL EXAMINATION:  GENERAL:  He is alert and oriented.  VITAL SIGNS:  Blood pressure is 152/84 and pulse 60.  LUNG:  Fields clear.  CARDIAC:  Rhythm is regular.  There are decreased breath sounds  bilaterally.   Electrocardiogram demonstrates normal sinus rhythm with voltage criteria  for LVH.   Carotid Dopplers reveal 60%-79% RICA stenosis and 0%-39% LICA stenosis.   IMPRESSION:  1. Coronary artery disease, treated medically.  2. Hypercholesterolemia, on lipid-lowering therapy.  3. Continued tobacco use.  4. Status post abdominal aortic aneurysm repair in 2000.  5. Hyperlipidemia.  6. Cerebrovascular disease.  7. Chronic obstructive pulmonary disease, ongoing tobacco use.  8. History of left nephrectomy, status post renal cell cancer in 1986.   PLAN:  1. Return to clinic in 6 months.  2. Continue current medical regimen.  3. His daughter and I have counseled him many times on smoking.     [name]. [name], MD, [facility]  Electronically Signed    TDS/MedQ  DD: 04/15/2008  DT: 04/16/2008  Job #: [id number]

## 2011-02-15 NOTE — Assessment & Plan Note (Signed)
[facility]                            CARDIOLOGY OFFICE NOTE   Craig Berry, Craig Berry                     MRN:          [id number]  DATE:09/09/2007                            DOB:          November 01, 1925    The patient is in for a follow-up visit.  In general he has been  somewhat more stable.  He has not had really any of his recurrent  episodes.  Importantly, he has not had syncope or presyncope.  He does  unfortunately continue to smoke, and we continued to have a discussion  about this today.  He has had recent hematologic evaluation again in  [place].  His most recent white count was 22,500, hematocrit 42, with a  normal platelet estimate.  He last underwent cardiac catheterization in  June of this past year.  He has diffuse coronary disease, and this is  all diffuse and segmental disease with calcification in small vessels.  He also has known high grade carotid disease followed over time.   MEDICATIONS:  1. Multivitamin daily.  2. Aspirin 81 mg daily.  3. Glucosamine 150 mg daily.  4. Fish Oil 1000 mg daily.  5. Simvastatin 20 mg q.h.s.  6. Amitriptyline 25 mg q.h.s.  7. Amlodipine 5 mg daily.  8. Probiotic daily.   PHYSICAL EXAMINATION:  GENERAL:  He is a thin older gentleman.  VITAL SIGNS:  Weight 144, blood pressure 162/83, pulse 67.  LUNGS:  Decreased breath sounds with prolonged expiration compatible  with chronic obstructive pulmonary disease.  He does not have a definite  murmur.  There is no definite extremity edema.   Last chest x-ray was July 10, 2007, which revealed no segmental region  of consolidation.  There were diffuse increased lung markings with  questionable minimal nodularity in the left upper lung zone.  He is  continuing to be followed by Dr. Lawana Pai in [place].  We will see him  back in follow-up in 2-3 months' time.     Craig Berry. Craig Kill, MD, [facility]  Electronically Signed    TDS/MedQ  DD: 10/12/2007  DT:  10/12/2007  Job #: [id number]   cc:   Fidela Salisbury, M.D., [facility]

## 2011-02-18 NOTE — Op Note (Signed)
[place]  Patient:    Craig Berry                      MRN: [id number] Proc. Date: 09/18/99 Adm. Date:  [id number] Attending:  Melvenia Needles CC:         Denman George, M.D.                           Operative Report  PREOPERATIVE DIAGNOSIS:  Rule out bladder lesions, bladder neck contracture.  POSTOPERATIVE DIAGNOSIS:  Bladder neck contracture.  OPERATION PERFORMED:  Cystoscopy.  SURGEON:  Maretta Bees. Vonita Moss, M.D.  ANESTHESIA:  Local.  INDICATIONS FOR PROCEDURE:  This 77 year old gentleman has undergone major vascular surgery.  At the time of his surgery, we had put in a Foley catheter but dilated a bladder neck contracture.  He gives a history of left nephrectomy for renal cell carcinoma and has right renal cysts and had TUR of the prostate and bladder biopsy done in 1987 in [place].  He was cystoscoped today to re-evaluate his bladder eck and bladder.  DESCRIPTION OF PROCEDURE:  The patient was prepped and draped in the usual fashion. Xylocaine jelly was injected.  The flexible cystoscope was inserted and the anterior urethra was normal.  The prostate was satisfactorily resected.  He had a bladder neck that showed evidence of recent dilation but seems to be satisfactorily patent at this time.  The bladder had no stones, tumors or inflammatory lesions. The scope was removed and the patient tolerated the procedure well. DD:  09/18/99 TD:  09/21/99 Job: [id number]

## 2011-02-18 NOTE — Discharge Summary (Signed)
[place]  Patient:    Craig Berry                      MRN: [id number] Adm. Date:  [id number] Disc. Date: [id number] Attending:  Melvenia Needles Dictator:   Luretha Rued Vedia Pereyra. CC:         Denman George, M.D.             Maretta Bees. Vonita Moss, M.D.             CVTS Office                           Discharge Summary  DATE OF BIRTH:  May 17, 1926  ADMITTING DIAGNOSES:  1. Abdominal aortic aneurysm.  2. History of renal carcinoma.  3. Coronary artery disease.  4. History of tobacco use.  5. Chronic obstructive pulmonary disease.  6. Hypercholesterolemia.  7. Hypertension.  8. History of cluster headaches.  9. Decreased hearing. 10. Gastroesophageal reflux disease. 11. Known ejection fraction of 53% with inferior thinning and no ischemia. 12. Status post nephrectomy secondary to renal carcinoma in 1986. 13. Status post prostate surgery in 1987. 14. Status post cardiac catheterization in 1996. 15. Status post arteriogram by Dr. Madilyn Fireman on August 20, 1999.  DISCHARGE DIAGNOSES:  1. Repair and grafting of abdominal aortic aneurysm with a 20 x 10 mm Hemashield     with an aortobifemoral bypass grafting and reimplantation of right renal artery     using a right renal artery end-to-end anastomosis with a 6 mm x 30 mm     Hemashield graft.  2. History of renal carcinoma.  3. Coronary artery disease.  4. History of tobacco use.  5. Chronic obstructive pulmonary disease.  6. Hypercholesterolemia.  7. Hypertension.  8. History of cluster headaches.  9. Decreased hearing. 10. Gastroesophageal reflux disease. 11. Known ejection fraction of 53% with inferior thinning and no ischemia. 12. Status post nephrectomy secondary to renal carcinoma in 1986. 13. Status post prostate surgery in 1987. 14. Status post cardiac catheterization in 1996. 15. Status post arteriogram by Dr. Madilyn Fireman on August 20, 1999. 16. Postoperative cellulitis from  IV site. 17. Postoperative low-grade fever and leukocytosis.  PROCEDURE PERFORMED DURING THIS ADMISSION:  Repair and grafting of abdominal aortic aneurysm on September 13, 1999.  HISTORY OF PRESENT ILLNESS:  [name] is a pleasant, 77 year old, white married male patient who has a known abdominal aortic aneurysm.  His aneurysm was originally diagnosed back in 1997.  This aneurysm measured 2.9 x 3.1 cm.  It has been followed up on a periodic basis and a CT scan performed at [facility] now shows further enlargement to 4.9 cm.  In May 2000, it was approximately 4.3 cm. An arteriogram on November 17 showed the presence of abdominal aortic aneurysm,  bilobed, at the _______ _______ just below the right main renal artery origin, extending into the aortic bifurcation.  The renal artery also showed a high degree of stenosis on the right side.  His common iliacs were tortuous bilaterally, but patent.  The patient is without complaints and is returning for continued evaluation.  He is now being admitted for elective revascularization and repair of his abdominal aortic aneurysm.  HOSPITAL COURSE:  Mr. Raden Byington was taken to the operating room on September 13, 1999 where he underwent a repair and grafting of his abdominal aortic aneurysm with a  Hemashield and a bilateral aortobifemoral bypass grafting.  He lso underwent a reimplantation of his right renal artery using a right renal artery  end-to-end anastomosis, with a 6 mm x 30 mm Hemashield graft.  He tolerated this without difficulty and was transferred to the SICU in stable condition.  His initial visit postoperatively revealed stable hemodynamics and satisfactory urine output.  On postop day #1 the patient was extubated.  He was alert and oriented, maintaining a cardiac index of 4.0.  His incisions were dry.  His hematocrit and hemoglobin were 11 and 32.  His platelet count was 250,000.  He had good  distal  pulses and was overall improving.  His course was complicated by some cellulitis of his IV site and possible urinary tract infection.  He was treated with antibiotics, and also an infectious disease consult was performed.  By postop day #2 the patients bowel sounds were returning slowly.  The patient was beginning to feel  hungry.  His hemoglobin was 10.2 with hematocrit of 30.2.  His diet was started  later in the evening on postop day #2.  His NG tube was discontinued.  He continued his antibiotics with vancomycin with a slight increase in his WBC count.  His WBC count was 23.6 on postop day #2.  By postop day #3 he continued to improve.  He  continued to have good distal pulses and adequate urine output.  By postop day 4 his white blood cell count was decreasing to 17.7.  His diet was increasing with no difficulty in his p.o. intake.  By postop day #5 he continued to improve.  His white count was now 16.7 with a stable hemoglobin of 10.5 and an hematocrit of 30.4.  His abdomen had positive bowel sounds.  The patient was eating without difficulty and by postop day #6 we had started our plans for his discharge home. However, he developed a low-grade temperature of 100.7.  Infectious disease consult was performed and Dr. Orvan Falconer changed his antibiotic to Flagyl and Mr. Bijan Ridgley was discharged with no further complications.  DISCHARGE MEDICATIONS:  1. Flagyl 500 mg 3 times daily.  2. Lopressor 25 mg p.o. q.d.  3. Zocor 10 mg p.o. q.d.  4. Aspirin 325 mg p.o. q.d.  5. Clonidine transdermal patch 0.3 mg patch each Tuesday.  6. Darvocet-N 100 one to two tablets every 4-6 hours p.r.n. pain.  DISCHARGE INSTRUCTIONS:  He is not to perform any heavy lifting.  No strenuous activity and no driving an automobile.  DIET:  He is to maintain a low fat, salt, and cholesterol diet.  WOUND CARE:  He is to keep his wounds clean and dry.  FOLLOWUP:  He is to return to the  [facility] on Friday, September 24, 1999, to have his   blood drawn, a CBC.  He is also to have his staples taken out on September 24, 1999 at 9:30.  He is to return to see Dr. Vonita Moss as needed and Dr. Madilyn Fireman in two weeks. DD:  11/10/99 TD:  11/10/99 Job: [id number]

## 2011-02-18 NOTE — Letter (Signed)
May 04, 2006     Roney Marion, M.D.  [address]  [place], [place]  [address]   RE:  Craig Berry, Craig Berry  MRN:  [id number]  /  DOB:  1925-11-11   Dear Rosanne Ashing,   I had the pleasure of seeing [name] in the office today in a follow-up  visit.  He has had a little bit of discomfort in the chest.  He has also had  some shortness of breath.  He had an episode of dizziness about two weeks  ago in which he became lightheaded.  Importantly, he activated his monitor  at that time and was in sinus rhythm, making the likelihood of some  underlying cardiac arrhythmia less likely.  The patient has also had carotid  studies, as you know.  He has a 40-59% RICA stenosis and a 0-39% LICA  stenosis.  Followup in one year has been recommended, principally in June,  2008.  He only rarely has substernal discomfort.   On exam today, the blood pressure was 132/74, and the pulse is 69.  There  are decreased breath sounds bilaterally.   He has been told, based on CT, that he has some worsening lung disease.  He  certainly is at high risk for coronary events, and he does continue to  smoke, and the patient, his daughter, and I all discussed this in some  detail today.  I would have a relatively low threshold for cardiac  catheterization, should there be any progressive in symptoms whatsoever, and  as I tried to explain to the patient and his daughter, his likelihood of  presenting as an acute coronary syndrome is greater as long as he continues  to smoke.  I believe they understand this concept.   At the present time, he has no acute changes on his electrocardiogram.  His  EKG today reveals a sinus rhythm.  We plan to have him return in followup  for about three months.  Should he have problems in the interim, he is to  contact us.    Sincerely,      [name]. [name], MD, [facility]   TDS/MedQ  DD:  05/04/2006  DT:  05/04/2006  Job #:  [id number]

## 2011-02-18 NOTE — Discharge Summary (Signed)
NAME:  Craig Berry, PAYER NO.:  [id number]   MEDICAL RECORD NO.:  [id number]          PATIENT TYPE:  INP   LOCATION:  2036                         FACILITY:  [facility]   PHYSICIAN:  [name]. [name], M.D. Romualdo Bolk OF BIRTH:  05-Dec-1925   DATE OF ADMISSION:  01/03/2006  DATE OF DISCHARGE:  01/04/2006                                 DISCHARGE SUMMARY   PRIMARY CARDIOLOGIST:  Dr. Shawnie Pons   PRINCIPAL DIAGNOSIS:  Chest pain.   OTHER DIAGNOSES:  1.  Coronary artery disease.  2.  Hypertension.  3.  Hyperlipidemia.  4.  Peripheral vascular disease.  5.  Chronic obstructive pulmonary disease.  6.  History of abdominal aortic aneurysm status post repair in 2000.  7.  History of renal cell carcinoma status post left nephrectomy in 1986.  8.  Ongoing tobacco abuse, currently one pack per day.  9.  Medical nonadherence.   ALLERGIES:  No known drug allergies.   PROCEDURES:  None.   HISTORY OF PRESENT ILLNESS:  This is a 77 year old white male with prior  history of medically-manageable CAD with catheterization performed in the  last 1990s.  The patient more or less came off of all his medications over  the years and only takes aspirin at home.  He was in his usual state of  health until about 6 weeks ago when he began to experience intermittent  midsternal chest pressure that primarily occurs at rest approximately once a  week without associated symptoms, lasting about 1 or 2 minutes and resolving  spontaneously.  He does usually take aspirin after symptom onset and notes  that the symptoms resolved by the time he has swallowed the aspirin.  Thus,  it is not clear that aspirin is actually affecting his symptoms any.  Despite these symptoms that occur at rest he does go dancing every Friday  night and has not had any exertional symptoms during those activities.  On  the day of admission he had an episode of discomfort that occurred in the  middle of the night, followed  by an episode that occurred at approximately 2  p.m. after using a chainsaw.  This prompted him to present to the ED on the  evening of April 3 where his ECG was without acute changes and cardiac  markers were negative.  He was admitted for further evaluation.   HOSPITAL COURSE:  Cardiac markers have been negative x2.  Craig Berry is  fairly adamant about being discharged today.  Considering that he has had  negative enzymes with ECG without acute changes, we have agreed to discharge  him this afternoon with plans for outpatient functional study on Monday,  January 09, 2006, with subsequent followup with Dr. [name] later that  afternoon.  We have reinitiated beta blocker and statin therapy and have  also given a prescription for sublingual nitroglycerin.  We have stressed  the importance of smoking cessation and he has been seen by the smoking  cessation counselor and told her that he will never stop smoking. He is  being discharged home today in satisfactory condition.  DISCHARGE LABORATORY DATA:  Hemoglobin 14.1, hematocrit 41.6, WBC 11.7,  platelets 545.  Sodium 138, potassium 4.0, chloride 110, CO2 28, BUN 17,  creatinine 1.2, glucose 110, total bilirubin 0.7, alkaline phosphatase 118,  AST 18, ALT 11.  Cardiac markers negative x1.  Calcium 9.2.  Hemoglobin A1c  pending.   DISPOSITION:  The patient is being discharged home today in good condition.   FOLLOWUP PLANS AND APPOINTMENTS:  He is scheduled for an adenosine Myoview  on January 09, 2006, at 8:15 a.m.  He will then follow up with Dr. [name] at 2  p.m. on April 9.   DISCHARGE MEDICATIONS:  1.  Aspirin 81 mg daily.  2.  Lopressor 12.5 mg b.i.d.  3.  Lipitor 10 mg daily.  4.  Nitroglycerin 0.4 mg sublingual p.r.n. chest pain.   OUTSTANDING LABORATORY STUDIES:  None.   DURATION OF DISCHARGE ENCOUNTER:  Forty minutes, including physician time.      Craig Anis, NP      [name]. [name], M.D. [facility]   Electronically Signed    CRB/MEDQ  D:  01/04/2006  T:  01/05/2006  Job:  [id number]

## 2011-02-18 NOTE — H&P (Signed)
NAME:  Craig Berry, PELISSIER NO.:  [id number]   MEDICAL RECORD NO.:  [id number]          PATIENT TYPE:  INP   LOCATION:  2036                         FACILITY:  [facility]   PHYSICIAN:  Arvilla Meres, M.D. [facility] OF BIRTH:  Nov 01, 1925   DATE OF ADMISSION:  01/03/2006  DATE OF DISCHARGE:                                HISTORY & PHYSICAL   CHIEF COMPLAINT:  Chest pain.   HISTORY OF PRESENT ILLNESS:  The patient is a 77 year old white male with  past medical history notable for coronary artery disease (medically managed)  and renal cell carcinoma status post nephrectomy in 1986 who presents with  chest pain.  The patient states that he has been experiencing occasional  chest pain over the last several weeks.  The patient characterizes the pain  as dull, substernal and without radiation.  He denies any associated nausea,  vomiting, shortness of breath, or diaphoresis.  The patient does experience  significant dyspnea on exertion, however, has untreated COPD.  He states  that he is at his baseline level of functioning.  The patient states his  last cardiac catheterization was in 1996 at which time he was found to have  coronary disease. However, he was treated medically.  He has had no formal  evaluation since that time.  The patient was formerly being treated with  beta blocker, aspirin and Statin therapy.  However, he has not been on any  medications for the last five years.  On presentation to the emergency room,  the EKG is notable for normal sinus rhythm without any ischemia or injury.  His initial cardiac markers by point of care testing was negative.  He is  currently chest painfree and otherwise without complaints.   PAST MEDICAL HISTORY:  1.  Status post left nephrectomy for renal cell carcinoma in 1986.  2.  Status post AAA repair in 2000.  3.  Hypertension.  4.  Hyperlipidemia.  5.  Peripheral vascular disease.  6.  COPD.   SOCIAL HISTORY:  The patient lives  alone in [place] and is retired.  He is widowed and is accompanied by his two children.  The patient endorses  a 71-pack-year smoking history.  He denies any alcohol or other illicits.  Lives a fairly sedentary lifestyle.   FAMILY HISTORY:  Significant for mother who died of a cancer and a strong  family history of cardiac disease with his father dying of heart disease and  complication of cancer at age 38.  One brother died at 81 of a MI and  another brother dying at the age of 23 from MI.   REVIEW OF SYSTEMS:  CONSTITUTIONAL:  No fevers, chills, sweats or  adenopathy.  HEENT:  No headaches, no nasal discharge.  No voice changes.  No vertigo, no photophobia.  No changes in vision.  SKIN:  No rashes or  lesions.  CARDIOPULMONARY:  As per HPI.  GU:  The patient denies any  dysuria, straining, hematuria.  NEUROPSYCHIATRIC:  The patient denies  weakness, numbness, mood disturbance.  MUSCULOSKELETAL:  The patient denies  any arthralgias, joint swellings or  deformities.  However, the patient does  endorse a fairly classic history for intermittent claudication with minimal  walking distances.  GI:  Denies nausea, vomiting, diarrhea, constipation,  bright red blood per rectum, melena, hematemesis, or dysphagia.  ENDOCRINE:  Denies polyuria, polydipsia, heat or cold intolerance.  All other systems  are negative.   PHYSICAL EXAMINATION:  VITAL SIGNS:  Blood pressure is 150/85, heart rate is  72, O2 saturations are 94% on room air.  GENERAL:  The patient is alert and oriented x3, in no acute distress,  pleasantly conversant.  HEENT:  Normocephalic and atraumatic.  EOMI.  PERRL.  Nares patent.  Oropharynx is clear.  NECK:  Supple.  Full range of motion.  No audible bruits and no notable JVD.  There is no lymphadenopathy.  CARDIOVASCULAR:  S1 and S2 without murmurs, gallops, or rubs.  PMI is the  midclavicular line.  LUNGS:  Clear to auscultation bilaterally with diminished breath sounds  at  the bases.  ABDOMEN:  Soft, nontender, and nondistended.  Positive bowel sounds.  No  hepatosplenomegaly.  EXTREMITIES:  No clubbing, cyanosis, or edema.  SKIN:  No rashes.  NEUROLOGICAL:  Cranial nerves II through XII grossly intact.  5/5 strength  throughout.  Sensation grossly intact.   Chest x-ray reveals hyperinflated lung fields.  No edema and no infiltrate.  EKG is normal sinus rhythm with no evidence of injury or ischemia.   LABORATORY DATA:  CBC:  White blood cell count 11.7, hematocrit is 41,  platelet count is 545,000.  CK-MB 1.6 and troponin less than 0.05 by point  of care testing.   ASSESSMENT/PLAN:  A 77 year old white male with past medical history notable  for coronary artery disease presenting with chest pain consistent with  angina.  Admit to telemetry and rule out myocardial infarction with serial  mile markers and EKG.  Plan for inpatient versus outpatient noninvasive  evaluation to rule out ischemia.  Pending outcome of invasive evaluation, we  will determine the need for further evaluation including heart  catheterization; however, he will be at high risk for contrast-induced  nephropathy with history of nephrectomy.  Continue aspirin and start  metoprolol 25 mg p.o. q.12h.  Hydralazine 10 mg IV q.6h. p.r.n. systolic  blood pressure greater than 150.  We will check a fasting lipid panel in the  a.m. and start Lipitor 40 mg p.o. q.h.s.     ______________________________  Antonietta Barcelona, M.S.      Arvilla Meres, M.D. [facility]  Electronically Signed    SG/MEDQ  D:  01/03/2006  T:  01/04/2006  Job:  [id number]

## 2011-02-18 NOTE — Consult Note (Signed)
[place]  Patient:    Craig Berry                      MRN: [id number] Proc. Date: 09/17/99 Adm. Date:  [id number] Attending:  Melvenia Needles CC:         Denman George, M.D.                          Consultation Report  Medical records were received from [facility], and it was documented that in 1987 he had a TUR of the prostate for BPH and also bladder biopsy that showed cystitis with atypia.  He also underwent a left radical nephrectomy for renal cell carcinoma.  In 1990, CT scan showed small right renal cysts, the largest of which was 1.7 cm in size. DD:  09/17/99 TD:  09/17/99 Job: [id number]

## 2011-02-18 NOTE — Assessment & Plan Note (Signed)
[facility]                            CARDIOLOGY OFFICE NOTE   Craig Berry, Craig Berry                     MRN:          [id number]  DATE:05/04/2007                            DOB:          1925-12-05    Mr. Candee is in for a followup visit.  From a cardiac standpoint, he  has been stable.  However, he had another episode where he got hot and  nearly fell out.  He says the amitriptyline had been stopped several  days earlier, and he attributes this to helping him.  He apparently has  had an extensive workup at [facility] and nothing has  been found.  The episodes sound somewhat neurally mediated.  He gets  hot.  Importantly, the patient will drink up to 4 to 6 cups of coffee in  the morning.  He often will not drink much water.  Sometimes, he gets a  little orthostatic by his description.   EXAMINATION:  Blood pressure is 138/74, and the pulse is 84.  There is  not a substantial drop with standing.  LUNGS:  Reveal decreased breath sounds bilaterally with prolonged  expiration.  CARDIAC:  Rhythm was regular.   We had a long discussion with Mr. Massenburg today.  He continues to smoke.  He also drinks lots of caffeine and his daughter and I both spoke with  him in some detail about this today.  I would encourage him to withhold  his caffeine, or cut it down substantially, and try to drink a fair  amount of water.  Whether or not this is neurally mediated is unclear,  but we have not identified any other source to date.  I will try to find  out more about his neurological evaluation.  His heart rate monitor has  not definitely been revealing.  We talked about neurally mediated  syncope in some detail today and what the potential mechanisms are.  He  will return to see me in followup in about 4 months.  Moreover, this  gentleman is at high risk for sudden cardiac death.  He has wide-spread  coronary artery disease, continues to smoke, and  has been unable or  unwilling to cut back.     [name]. [name], MD, [facility]  Electronically Signed    TDS/MedQ  DD: 05/04/2007  DT: 05/05/2007  Job #: [id number]

## 2011-02-18 NOTE — Assessment & Plan Note (Signed)
[facility]                            CARDIOLOGY OFFICE NOTE   AENEAS, LONGSWORTH                     MRN:          [id number]  DATE:12/19/2006                            DOB:          1926-02-01    Mr. Mcfadden is in for followup.  In general, he has been stable.  He has  only had a rare episode of chest pain.  He has not had exertional  symptoms.  He does have some exertional shortness of breath, which has  been fairly chronic.  He does continue to smoke, unfortunately.  Since  going on two aspirins a day, his episodes of dizziness have resolved.  He was seen by the neurologists and had a fairly extensive workup.  This  included an MRI.  He has been placed on amitriptyline.   MEDICATIONS INCLUDE:  1. A multivitamin daily.  2. Aspirin 81 mg b.i.d.  3. Glucosamine.  4. Fish oil 1000 mg daily.  5. Simvastatin 20 mg q.h.s.  6. Amitriptyline 25 mg q.h.s.   PHYSICAL EXAM:  On physical today, the blood pressure initially was  147/70, repeat was 160/80.  The lung fields reveal some delay in  expiration and decreased breath sounds bilaterally.  The cardiac rhythm  is regular, without a significant murmur noted.  His extremities  revealed no edema.   Electrocardiogram demonstrates normal sinus rhythm, nonspecific ST  abnormalities.   Mr. Waide remains stable at the present time from a cardiac  standpoint.  I am concerned about his overall situation.  I plan to see  him back in followup in about three months.  He clearly has underlying  coronary artery disease, and his symptoms have been somewhat  intermittent.  He does also continue to smoke.  Should he have problems  in the interim, he is to contact us and we will otherwise see him back  in followup in three months.     [name]. [name], MD, [facility]  Electronically Signed    TDS/MedQ  DD: 12/19/2006  DT: 12/20/2006  Job #: [id number]

## 2011-02-18 NOTE — Assessment & Plan Note (Signed)
[facility]                              CARDIOLOGY OFFICE NOTE   Craig, Berry                     MRN:          [id number]  DATE:08/14/2006                            DOB:          January 11, 1926    [name] is in for follow-up. In general, he is stable. He has not been  having any ongoing chest pain. He has had some chronic dizziness and he has  had an MRI. Other studies have been recommended and he is going to have  these done earlier this week and he is supposed to see the neurologist after  this. His event monitor was negative for significant arrhythmias during the  episode of dizziness. The patient continues to remain active, although he  says he has slowed down a little bit. Again, he denies any ongoing chest  pain. He does continue to smoke.   MEDICATIONS:  His medications include:  1. Aspirin 81 mg daily.  2. Multivitamin daily.  3. Vytorin 10/20, although he is not taking the latter.   PHYSICAL EXAMINATION:  On physical examination today, blood pressure is  158/82 and the pulse is 58.  Breath sounds are decreased bilaterally.   IMPRESSION:  1. Cardiac catheterization in 1990, demonstrating 50% narrowing of the      proximal ramus intermedius, 50% to 70% narrowing of the relatively      small circumflex and normal left ventricular function with followup      catheterization in 1996, demonstrating 30% to 40% distal left main, 30%      to 40% proximal LAD, 50% to 70% ramus and 70% circumflex, 40 and 30%      lesions and a small distal abdominal aortic aneurysm.  2. History of abdominal aortic aneurysm, status post repair in 2000.  3. History of renal cell carcinoma, status post left nephrectomy in 1986.  4. Chronic obstructive pulmonary disease.   RECOMMENDATIONS:  1. Discontinue smoking.  2. Long discussion today about the benefits of taking lipid-lowering      agents.  3. Encouraged followup with neurology.  4. Return to  clinic in six months.   ADDENDUM:  EKG: Reveals sinus bradycardia. Otherwise, normal.     Craig Berry. Craig Kill, MD, [facility]  Electronically Signed   TDS/MedQ  DD: 08/14/2006  DT: 08/14/2006  Job #: [id number]   cc:   Craig Marion, MD

## 2011-03-23 ENCOUNTER — Encounter: Payer: Self-pay | Admitting: Cardiology

## 2011-05-05 ENCOUNTER — Other Ambulatory Visit: Payer: Self-pay | Admitting: Cardiology

## 2011-05-05 DIAGNOSIS — I6529 Occlusion and stenosis of unspecified carotid artery: Secondary | ICD-10-CM

## 2011-05-09 ENCOUNTER — Encounter (INDEPENDENT_AMBULATORY_CARE_PROVIDER_SITE_OTHER): Payer: Medicare Other | Admitting: *Deleted

## 2011-05-09 ENCOUNTER — Encounter: Payer: Self-pay | Admitting: Cardiology

## 2011-05-09 ENCOUNTER — Ambulatory Visit (INDEPENDENT_AMBULATORY_CARE_PROVIDER_SITE_OTHER): Payer: Medicare Other | Admitting: Cardiology

## 2011-05-09 VITALS — BP 155/70 | HR 73 | Resp 16 | Ht 71.0 in | Wt 133.0 lb

## 2011-05-09 DIAGNOSIS — R55 Syncope and collapse: Secondary | ICD-10-CM

## 2011-05-09 DIAGNOSIS — E785 Hyperlipidemia, unspecified: Secondary | ICD-10-CM

## 2011-05-09 DIAGNOSIS — I2510 Atherosclerotic heart disease of native coronary artery without angina pectoris: Secondary | ICD-10-CM

## 2011-05-09 DIAGNOSIS — I6529 Occlusion and stenosis of unspecified carotid artery: Secondary | ICD-10-CM

## 2011-05-09 DIAGNOSIS — I679 Cerebrovascular disease, unspecified: Secondary | ICD-10-CM

## 2011-05-09 NOTE — Patient Instructions (Signed)
Your physician recommends that you schedule a follow-up appointment in: 1 year  

## 2011-05-10 NOTE — Assessment & Plan Note (Signed)
Managed by primary care.   On simva and amlodipine, so may need to adjust due to drug interaction.  Probably switch to pravachol.

## 2011-05-10 NOTE — Assessment & Plan Note (Signed)
Currently stable without angina.  CAD is known.

## 2011-05-10 NOTE — Progress Notes (Signed)
HPI:  Mr. 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  He is doing pretty well, if not slightly less active.  No syncope, and no chest pain.  Still smokes some.  Carotids were done today and they demonstrate moderate plaque bilaterally, but not surgical disease.  His breath is unchanged.  No chest pain.      Current Outpatient Prescriptions  Medication Sig Dispense Refill  . amitriptyline (ELAVIL) 25 MG tablet Take 25 mg by mouth at bedtime.        Marland Kitchen aspirin 81 MG tablet Take 81 mg by mouth 2 (two) times daily.        . Bilberry 80 MG CAPS Take 1 capsule by mouth daily.        . fish oil-omega-3 fatty acids 1000 MG capsule Take 1 g by mouth 2 (two) times daily.        . Multiple Vitamin (MULTIVITAMIN) capsule Take 1 capsule by mouth daily.        . Multiple Vitamins-Minerals (EYE VITAMINS PO) Take 1 capsule by mouth 2 (two) times daily.        . NORVASC 5 MG tablet TAKE 1&1/2 TABLETS EVERY OTHER DAY.  45 each  6  . PROBIOTIC CAPS Take 1 capsule by mouth daily.        . Red Yeast Rice 600 MG CAPS Take 1 capsule by mouth daily.        . simvastatin (ZOCOR) 40 MG tablet Take 20 mg by mouth as directed.       . zolpidem (AMBIEN) 5 MG tablet Take 5 mg by mouth at bedtime as needed.          No Known Allergies  Past Medical History  Diagnosis Date  . CAD (coronary artery disease)   . PVD (peripheral vascular disease)   . HTN (hypertension)   . Hyperlipidemia   . History of abdominal aortic aneurysm repair   . Cerebrovascular disease   . Renal cell cancer   . COPD (chronic obstructive pulmonary disease)     Past Surgical History  Procedure Date  . Nephrectomy   . Abdominal aortic aneurysm repair 2000    Family History  Problem Relation Age of Onset  . Heart disease    . Cancer Mother     deceased  . Heart attack Father     deceased  . Cancer Father     deceased  . Heart attack Brother 80    deceased    History   Social History  . Marital Status: Widowed    Spouse  Name: N/A    Number of Children: N/A  . Years of Education: N/A   Occupational History  . retired    Social History Main Topics  . Smoking status: Current Everyday Smoker  . Smokeless tobacco: Not on file  . Alcohol Use: Not on file  . Drug Use: Not on file  . Sexually Active: Not on file   Other Topics Concern  . Not on file   Social History Narrative  . No narrative on file    ROS: Please see the HPI.  All other systems reviewed and negative.  PHYSICAL EXAM:  BP 155/70  Pulse 73  Resp 16  Ht 5\' 11"  (1.803 m)  Wt 133 lb (60.328 kg)  BMI 18.55 kg/m2  General: Thin older man in no distress.  Looks like [employer].   Head:  Normocephalic and atraumatic. Neck: no JVD.   Bilateral carotid  bruits.   Lungs: Decrease BS bilaterally with prolonged expiration.   Heart: Normal S1 and S2.  No murmur, rubs or gallops.  Abdomen:  Normal bowel sounds; soft; non tender; no organomegaly Extremities: No clubbing or cyanosis. No edema. Neurologic: Alert and oriented x 3.  EKG:  NSR.  Sinus arrhythmia.  Non specific ST change.    ASSESSMENT AND PLAN:

## 2011-05-10 NOTE — Assessment & Plan Note (Signed)
See carotid studies.  Stable at present.

## 2011-05-10 NOTE — Assessment & Plan Note (Signed)
No further spells. Think much of it has been neurally mediated in the past.  Hydration stressed.

## 2011-05-12 ENCOUNTER — Encounter: Payer: Self-pay | Admitting: Cardiology

## 2012-01-24 ENCOUNTER — Telehealth: Payer: Self-pay | Admitting: Cardiology

## 2012-01-24 NOTE — Telephone Encounter (Signed)
Pt was told he needed a vascular work up and pt thought it was with dr [name], dtr calling to see if it needs to be with him or someone else? pls call dtr

## 2012-01-24 NOTE — Telephone Encounter (Signed)
I spoke with the pt's daughter and she said that Dr Marina Goodell recommended a vascular test be done by Dr [name] but she is unsure what test needs to be ordered.  She will contact Dr Lamar Sprinkles office and have them fax [place] a note so that we can order the recommended test.

## 2012-01-27 NOTE — Telephone Encounter (Signed)
Per Dr [name] he needs to see the pt in the office before ordering vascular testing. I spoke with the pt's daughter and scheduled appointment on 02/02/12.

## 2012-02-02 ENCOUNTER — Ambulatory Visit (INDEPENDENT_AMBULATORY_CARE_PROVIDER_SITE_OTHER): Payer: Medicare Other | Admitting: Cardiology

## 2012-02-02 VITALS — BP 140/73 | HR 70 | Resp 18 | Ht 66.0 in | Wt 136.8 lb

## 2012-02-02 DIAGNOSIS — I679 Cerebrovascular disease, unspecified: Secondary | ICD-10-CM

## 2012-02-02 DIAGNOSIS — I10 Essential (primary) hypertension: Secondary | ICD-10-CM

## 2012-02-02 DIAGNOSIS — I739 Peripheral vascular disease, unspecified: Secondary | ICD-10-CM

## 2012-02-02 DIAGNOSIS — Z9889 Other specified postprocedural states: Secondary | ICD-10-CM

## 2012-02-02 DIAGNOSIS — I2510 Atherosclerotic heart disease of native coronary artery without angina pectoris: Secondary | ICD-10-CM

## 2012-02-02 DIAGNOSIS — E785 Hyperlipidemia, unspecified: Secondary | ICD-10-CM

## 2012-02-02 NOTE — Patient Instructions (Signed)
Your physician has requested that you have a lower extremity arterial duplex. This test is an ultrasound of the arteries in the legs. It looks at arterial blood flow in the legs. Allow one hour for Lower Arterial scans. There are no restrictions or special instructions  Your physician has requested that you have an abdominal aorta duplex. During this test, an ultrasound is used to evaluate the aorta. Allow 30 minutes for this exam. Do not eat after midnight the day before and avoid carbonated beverages  Your physician recommends that you schedule a follow-up appointment in: 6 WEEKS with Dr [name]  Your physician recommends that you continue on your current medications as directed. Please refer to the Current Medication list given to you today.

## 2012-02-03 ENCOUNTER — Encounter: Payer: Self-pay | Admitting: Cardiology

## 2012-02-03 NOTE — Assessment & Plan Note (Signed)
Controlled.  

## 2012-02-03 NOTE — Assessment & Plan Note (Signed)
I think followed by his primary.  I suggested he stop red yeast rice since on statin.

## 2012-02-03 NOTE — Assessment & Plan Note (Signed)
See last doppler.

## 2012-02-03 NOTE — Progress Notes (Signed)
HPI: His daughter wanted him seen earlier.  He is having back issues.  His back bothers him.  So does his legs.  He has been told he has arthritic changes, but he does have known PVD, and this has not been evaluated recently.  No chest pain.  His appetite is poor however.  He apparently had endo in [place]--per patient, nothing was found.  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  STILL SMOKING.   Current Outpatient Prescriptions  Medication Sig Dispense Refill  . amitriptyline (ELAVIL) 25 MG tablet Take 25 mg by mouth at bedtime.        Marland Kitchen aspirin 81 MG tablet Take 81 mg by mouth 2 (two) times daily.        . Bilberry 80 MG CAPS Take 1 capsule by mouth daily.        . fish oil-omega-3 fatty acids 1000 MG capsule Take 1 g by mouth 2 (two) times daily.        . Multiple Vitamin (MULTIVITAMIN) capsule Take 1 capsule by mouth daily.        . Multiple Vitamins-Minerals (EYE VITAMINS PO) Take 1 capsule by mouth 2 (two) times daily.        . NORVASC 5 MG tablet TAKE 1&1/2 TABLETS EVERY OTHER DAY.  45 each  6  . PROBIOTIC CAPS Take 1 capsule by mouth daily.        . Red Yeast Rice 600 MG CAPS Take 1 capsule by mouth daily.        . simvastatin (ZOCOR) 40 MG tablet Take 20 mg by mouth as directed.       . zolpidem (AMBIEN) 5 MG tablet Take 5 mg by mouth at bedtime as needed.          No Known Allergies  Past Medical History  Diagnosis Date  . CAD (coronary artery disease)   . PVD (peripheral vascular disease)   . HTN (hypertension)   . Hyperlipidemia   . History of abdominal aortic aneurysm repair   . Cerebrovascular disease   . Renal cell cancer   . COPD (chronic obstructive pulmonary disease)     Past Surgical History  Procedure Date  . Nephrectomy   . Abdominal aortic aneurysm repair 2000    Family History  Problem Relation Age of Onset  . Heart disease    . Cancer Mother     deceased  . Heart attack Father     deceased  . Cancer Father     deceased  . Heart attack  Brother 68    deceased    History   Social History  . Marital Status: Widowed    Spouse Name: N/A    Number of Children: N/A  . Years of Education: N/A   Occupational History  . retired    Social History Main Topics  . Smoking status: Current Everyday Smoker  . Smokeless tobacco: Not on file  . Alcohol Use: Not on file  . Drug Use: Not on file  . Sexually Active: Not on file   Other Topics Concern  . Not on file   Social History Narrative  . No narrative on file    ROS: Please see the HPI.  All other systems reviewed and negative.  PHYSICAL EXAM:  BP 140/73  Pulse 70  Resp 18  Ht 5\' 6"  (1.676 m)  Wt 136 lb 12.8 oz (62.052 kg)  BMI 22.08 kg/m2  SpO2 94%  General: Thin older gentleman.  Head:  Normocephalic and atraumatic. Neck: no JVD Lungs: Decreased BS and prolonged expiration.   Heart: Normal S1 and S2.  No murmur, rubs or gallops.  Abdomen:  Normal bowel sounds; soft; non tender; no organomegaly.  Incision from AAA repair.  Pulsatile.   Pulses: prom R fem bruit.  None on L.  Decrease popliteal pulses bilaterally.   Extremities: No clubbing or cyanosis. No edema. Neurologic: Alert and oriented x 3.  EKG:  NSR.  Nospecific ST abnormality.   ASSESSMENT AND PLAN:

## 2012-02-03 NOTE — Assessment & Plan Note (Addendum)
No recurrent angina.  He does have some LMCA disease, but it has been managed medically as not high grade on last cath.  Continues to smoke and he understands risk of ACS cannot be controlled.

## 2012-02-03 NOTE — Assessment & Plan Note (Signed)
With back pain, will do follow up US to assess aorta.  Moderate pulsation on exam, but likely to his body habitus.

## 2012-02-23 ENCOUNTER — Encounter (INDEPENDENT_AMBULATORY_CARE_PROVIDER_SITE_OTHER): Payer: Medicare Other

## 2012-02-23 DIAGNOSIS — I739 Peripheral vascular disease, unspecified: Secondary | ICD-10-CM

## 2012-02-23 DIAGNOSIS — Z9889 Other specified postprocedural states: Secondary | ICD-10-CM

## 2012-02-23 DIAGNOSIS — I700 Atherosclerosis of aorta: Secondary | ICD-10-CM

## 2012-02-23 DIAGNOSIS — I2510 Atherosclerotic heart disease of native coronary artery without angina pectoris: Secondary | ICD-10-CM

## 2012-03-01 ENCOUNTER — Encounter (INDEPENDENT_AMBULATORY_CARE_PROVIDER_SITE_OTHER): Payer: Medicare Other

## 2012-03-01 DIAGNOSIS — I739 Peripheral vascular disease, unspecified: Secondary | ICD-10-CM

## 2012-03-01 DIAGNOSIS — I7092 Chronic total occlusion of artery of the extremities: Secondary | ICD-10-CM

## 2012-03-01 DIAGNOSIS — I2510 Atherosclerotic heart disease of native coronary artery without angina pectoris: Secondary | ICD-10-CM

## 2012-03-14 ENCOUNTER — Encounter: Payer: Self-pay | Admitting: Cardiology

## 2012-03-14 ENCOUNTER — Ambulatory Visit (INDEPENDENT_AMBULATORY_CARE_PROVIDER_SITE_OTHER): Payer: Medicare Other | Admitting: Cardiology

## 2012-03-14 VITALS — BP 160/90 | HR 72 | Ht 66.0 in | Wt 134.0 lb

## 2012-03-14 DIAGNOSIS — I2510 Atherosclerotic heart disease of native coronary artery without angina pectoris: Secondary | ICD-10-CM

## 2012-03-14 DIAGNOSIS — I10 Essential (primary) hypertension: Secondary | ICD-10-CM

## 2012-03-14 DIAGNOSIS — F17200 Nicotine dependence, unspecified, uncomplicated: Secondary | ICD-10-CM

## 2012-03-14 DIAGNOSIS — E785 Hyperlipidemia, unspecified: Secondary | ICD-10-CM

## 2012-03-14 NOTE — Progress Notes (Signed)
HPI:  The patient is in for followup. He's here to review his peripheral vascular studies. The patient is a patent aortofemoral bypass graft he is occlusion of the superficial femorals on both sides. He has an extensive network of collaterals. There are verbalize in the mid range the left is in the moderate range. The patient unfortunately continues to smoke. He is pretty set in his ways. The findings do not suggest the likelihood that he get a lot of benefit from extensive peripheral vascular surgery.  Some medication might improve his symptoms, but with his age, he would have to be more committed to getting better.  Not currently getting any chest pain.    Current Outpatient Prescriptions  Medication Sig Dispense Refill  . amitriptyline (ELAVIL) 25 MG tablet Take 25 mg by mouth at bedtime.        Marland Kitchen aspirin 81 MG tablet Take 81 mg by mouth 2 (two) times daily.        . Bilberry 80 MG CAPS Take 1 capsule by mouth daily.        . fish oil-omega-3 fatty acids 1000 MG capsule Take 1 g by mouth 2 (two) times daily.        . Multiple Vitamin (MULTIVITAMIN) capsule Take 1 capsule by mouth daily.        . Multiple Vitamins-Minerals (EYE VITAMINS PO) Take 1 capsule by mouth 2 (two) times daily.        . NORVASC 5 MG tablet TAKE 1&1/2 TABLETS EVERY OTHER DAY.  45 each  6  . PROBIOTIC CAPS Take 1 capsule by mouth daily.        . Red Yeast Rice 600 MG CAPS Take 1 capsule by mouth daily.        . simvastatin (ZOCOR) 40 MG tablet Take 20 mg by mouth as directed.       . zolpidem (AMBIEN) 5 MG tablet Take 5 mg by mouth at bedtime as needed.          No Known Allergies  Past Medical History  Diagnosis Date  . CAD (coronary artery disease)   . PVD (peripheral vascular disease)   . HTN (hypertension)   . Hyperlipidemia   . History of abdominal aortic aneurysm repair   . Cerebrovascular disease   . Renal cell cancer   . COPD (chronic obstructive pulmonary disease)     Past Surgical History    Procedure Date  . Nephrectomy   . Abdominal aortic aneurysm repair 2000    Family History  Problem Relation Age of Onset  . Heart disease    . Cancer Mother     deceased  . Heart attack Father     deceased  . Cancer Father     deceased  . Heart attack Brother 32    deceased    History   Social History  . Marital Status: Widowed    Spouse Name: N/A    Number of Children: N/A  . Years of Education: N/A   Occupational History  . retired    Social History Main Topics  . Smoking status: Current Everyday Smoker  . Smokeless tobacco: Not on file  . Alcohol Use: Not on file  . Drug Use: Not on file  . Sexually Active: Not on file   Other Topics Concern  . Not on file   Social History Narrative  . No narrative on file    ROS: Please see the HPI.  All other systems reviewed  and negative.  PHYSICAL EXAM:  BP 160/90  Pulse 72  Ht 5\' 6"  (1.676 m)  Wt 134 lb (60.782 kg)  BMI 21.63 kg/m2  General: Thin older gentleman resembling Craig Berry.   Head:  Normocephalic and atraumatic. Neck: no JVD.  Carotid bruits.   Lungs: Prolonged expiration and decrease BS consistent with COPD. Heart: Normal S1 and S2.  No murmur, rubs or gallops.  Abdomen:  Normal bowel sounds; soft; non tender; no organomegaly Pulses: reduced pulses in the feet.   Extremities: No clubbing or cyanosis. No edema. Neurologic: Alert and oriented x 3.  EKG:  See PVD study.    ASSESSMENT AND PLAN:

## 2012-03-14 NOTE — Patient Instructions (Addendum)
Your physician wants you to follow-up in: 4 MONTHS You will receive a reminder letter in the mail two months in advance. If you don't receive a letter, please call our office to schedule the follow-up appointment.  

## 2012-04-15 NOTE — Assessment & Plan Note (Signed)
Unlikely to change.

## 2012-04-15 NOTE — Assessment & Plan Note (Signed)
Probably should consider switching to pravastatin as he is on amlodipine.  Will make that switch.

## 2012-04-15 NOTE — Assessment & Plan Note (Signed)
Under reasonable control at present.

## 2012-04-15 NOTE — Assessment & Plan Note (Signed)
No new symptoms.

## 2012-06-07 ENCOUNTER — Other Ambulatory Visit: Payer: Self-pay | Admitting: Cardiology

## 2012-06-07 ENCOUNTER — Other Ambulatory Visit: Payer: Self-pay | Admitting: *Deleted

## 2012-06-07 DIAGNOSIS — I6529 Occlusion and stenosis of unspecified carotid artery: Secondary | ICD-10-CM

## 2012-06-08 ENCOUNTER — Encounter: Payer: Self-pay | Admitting: Cardiology

## 2012-06-08 ENCOUNTER — Encounter (INDEPENDENT_AMBULATORY_CARE_PROVIDER_SITE_OTHER): Payer: Medicare Other

## 2012-06-08 ENCOUNTER — Ambulatory Visit (INDEPENDENT_AMBULATORY_CARE_PROVIDER_SITE_OTHER): Payer: Medicare Other | Admitting: Cardiology

## 2012-06-08 VITALS — BP 161/77 | HR 66 | Ht 66.0 in | Wt 132.0 lb

## 2012-06-08 DIAGNOSIS — I6529 Occlusion and stenosis of unspecified carotid artery: Secondary | ICD-10-CM

## 2012-06-08 DIAGNOSIS — I2510 Atherosclerotic heart disease of native coronary artery without angina pectoris: Secondary | ICD-10-CM

## 2012-06-08 DIAGNOSIS — E785 Hyperlipidemia, unspecified: Secondary | ICD-10-CM

## 2012-06-08 DIAGNOSIS — I10 Essential (primary) hypertension: Secondary | ICD-10-CM

## 2012-06-08 NOTE — Assessment & Plan Note (Signed)
Elevated but did not take medications.

## 2012-06-08 NOTE — Patient Instructions (Addendum)
Your physician wants you to follow-up in: MARCH 2014. You will receive a reminder letter in the mail two months in advance. If you don't receive a letter, please call our office to schedule the follow-up appointment.  Your physician recommends that you continue on your current medications as directed. Please refer to the Current Medication list given to you today.  

## 2012-06-08 NOTE — Assessment & Plan Note (Signed)
No recurrent chest pain.  Stable at the present time.

## 2012-06-08 NOTE — Progress Notes (Signed)
HPI:  In for followup. From a clinical standpoint he is really doing pretty well. He is wearing oxygen at night, and his daughter feels that this helped quite a bit. He denies any ongoing chest pain. It is notable that he did not take his medications last evening. Last, his blood pressure is elevated.  Current Outpatient Prescriptions  Medication Sig Dispense Refill  . amitriptyline (ELAVIL) 25 MG tablet Take 25 mg by mouth at bedtime.        Marland Kitchen aspirin 81 MG tablet Take 81 mg by mouth 2 (two) times daily.        . fish oil-omega-3 fatty acids 1000 MG capsule Take 1 g by mouth 2 (two) times daily.        Marland Kitchen HORSE CHESTNUT PO Take by mouth every morning.      . Multiple Vitamin (MULTIVITAMIN) capsule Take 1 capsule by mouth daily.        . Multiple Vitamins-Minerals (EYE VITAMINS PO) Take 1 capsule by mouth 2 (two) times daily.        . NORVASC 5 MG tablet TAKE 1&1/2 TABLETS EVERY OTHER DAY.  45 each  6  . omeprazole (PRILOSEC) 20 MG capsule Take 1 tablet by mouth Daily.      Marland Kitchen PROBIOTIC CAPS Take 1 capsule by mouth daily.        . Red Yeast Rice 600 MG CAPS Take 1 capsule by mouth daily.        . sertraline (ZOLOFT) 25 MG tablet Take 0.5 tablets by mouth Daily.      . simvastatin (ZOCOR) 40 MG tablet Take 20 mg by mouth as directed.       . zolpidem (AMBIEN) 5 MG tablet Take 5 mg by mouth at bedtime as needed.          No Known Allergies  Past Medical History  Diagnosis Date  . CAD (coronary artery disease)   . PVD (peripheral vascular disease)   . HTN (hypertension)   . Hyperlipidemia   . History of abdominal aortic aneurysm repair   . Cerebrovascular disease   . Renal cell cancer   . COPD (chronic obstructive pulmonary disease)     Past Surgical History  Procedure Date  . Nephrectomy   . Abdominal aortic aneurysm repair 2000    Family History  Problem Relation Age of Onset  . Heart disease    . Cancer Mother     deceased  . Heart attack Father     deceased  . Cancer  Father     deceased  . Heart attack Brother 11    deceased    History   Social History  . Marital Status: Widowed    Spouse Name: N/A    Number of Children: N/A  . Years of Education: N/A   Occupational History  . retired    Social History Main Topics  . Smoking status: Current Everyday Smoker  . Smokeless tobacco: Not on file  . Alcohol Use: Not on file  . Drug Use: Not on file  . Sexually Active: Not on file   Other Topics Concern  . Not on file   Social History Narrative  . No narrative on file    ROS: Please see the HPI.  All other systems reviewed and negative.  PHYSICAL EXAM:  BP 161/77  Pulse 66  Ht 5\' 6"  (1.676 m)  Wt 132 lb (59.875 kg)  BMI 21.31 kg/m2  SpO2 91%  General: thin  older gentleman in no distress.   Head:  Normocephalic and atraumatic. Neck: no JVD Lungs: decrease BS with prolonged expiration.  Heart: Normal S1 and S2.  No murmur, rubs or gallops.  Pulses: Pulses normal in all 4 extremities. Extremities: No clubbing or cyanosis. No edema. Neurologic: Alert and oriented x 3.  EKG:  SB.  ST abnormality, nonspecific.    ASSESSMENT AND PLAN:

## 2012-06-08 NOTE — Assessment & Plan Note (Signed)
Followed by Dr Perry 

## 2012-12-05 ENCOUNTER — Ambulatory Visit (INDEPENDENT_AMBULATORY_CARE_PROVIDER_SITE_OTHER): Payer: Medicare Other | Admitting: Cardiology

## 2012-12-05 ENCOUNTER — Encounter: Payer: Self-pay | Admitting: Cardiology

## 2012-12-05 VITALS — BP 186/90 | HR 55 | Ht 71.0 in | Wt 140.0 lb

## 2012-12-05 DIAGNOSIS — I679 Cerebrovascular disease, unspecified: Secondary | ICD-10-CM

## 2012-12-05 DIAGNOSIS — I739 Peripheral vascular disease, unspecified: Secondary | ICD-10-CM

## 2012-12-05 NOTE — Patient Instructions (Addendum)
Your physician recommends that you continue on your current medications as directed. Please refer to the Current Medication list given to you today.  Your physician recommends that you schedule a follow-up appointment in: 3 MONTHS with Dr Dulce Sellar in [place] (release of records signed today)

## 2012-12-06 ENCOUNTER — Telehealth: Payer: Self-pay | Admitting: Cardiology

## 2012-12-06 NOTE — Progress Notes (Signed)
HPI:  He has done pretty well.  No acute complaints.  He does feel tired, and remains some chronically short of breath and wonders why---but he continues to smoke which he has done for over 70 years.    Current Outpatient Prescriptions  Medication Sig Dispense Refill  . amitriptyline (ELAVIL) 25 MG tablet Take 25 mg by mouth at bedtime.        Marland Kitchen aspirin 81 MG tablet Take 81 mg by mouth 2 (two) times daily.        . fish oil-omega-3 fatty acids 1000 MG capsule Take 1 g by mouth 2 (two) times daily.        Marland Kitchen HORSE CHESTNUT PO Take by mouth every morning.      . Multiple Vitamin (MULTIVITAMIN) capsule Take 1 capsule by mouth daily.        . Multiple Vitamins-Minerals (EYE VITAMINS PO) Take 1 capsule by mouth 2 (two) times daily.        . NORVASC 5 MG tablet TAKE 1&1/2 TABLETS EVERY OTHER DAY.  45 each  6  . omeprazole (PRILOSEC) 20 MG capsule Take 1 tablet by mouth Daily.      Marland Kitchen PROBIOTIC CAPS Take 1 capsule by mouth daily.        . Red Yeast Rice 600 MG CAPS Take 1 capsule by mouth daily.        . sertraline (ZOLOFT) 25 MG tablet Take 0.5 tablets by mouth Daily.      . simvastatin (ZOCOR) 40 MG tablet Take 20 mg by mouth as directed.       . zolpidem (AMBIEN) 5 MG tablet Take 5 mg by mouth at bedtime as needed.         No current facility-administered medications for this visit.    No Known Allergies  Past Medical History  Diagnosis Date  . CAD (coronary artery disease)   . PVD (peripheral vascular disease)   . HTN (hypertension)   . Hyperlipidemia   . History of abdominal aortic aneurysm repair   . Cerebrovascular disease   . Renal cell cancer   . COPD (chronic obstructive pulmonary disease)     Past Surgical History  Procedure Laterality Date  . Nephrectomy    . Abdominal aortic aneurysm repair  2000    Family History  Problem Relation Age of Onset  . Heart disease    . Cancer Mother     deceased  . Heart attack Father     deceased  . Cancer Father     deceased    . Heart attack Brother 19    deceased    History   Social History  . Marital Status: Widowed    Spouse Name: N/A    Number of Children: N/A  . Years of Education: N/A   Occupational History  . retired    Social History Main Topics  . Smoking status: Current Every Day Smoker  . Smokeless tobacco: Not on file  . Alcohol Use: Not on file  . Drug Use: Not on file  . Sexually Active: Not on file   Other Topics Concern  . Not on file   Social History Narrative  . No narrative on file    ROS: Please see the HPI.  All other systems reviewed and negative.  PHYSICAL EXAM:  BP 186/90  Pulse 55  Ht 5\' 11"  (1.803 m)  Wt 140 lb (63.504 kg)  BMI 19.53 kg/m2  SpO2 94%  General: Thin, elderly,  chronically ill appearing--he does [name] impersonations professionally Head:  Normocephalic and atraumatic. Neck: no JVD.  Soft R carotid bruit.   Lungs: decrease BS bilaterally with prolonged expiration.   Heart: Normal S1 and S2.  No murmur, rubs or gallops.  Abdomen:  Normal bowel sounds; soft; non tender; no organomegaly Extremities: No clubbing or cyanosis. No edema. Neurologic: Alert and oriented x 3. Decrease pulses in feet.    EKG:  SB.  LVH with mild repole changes.  No change from prior tracing.    ASSESSMENT AND PLAN:

## 2012-12-06 NOTE — Telephone Encounter (Signed)
All Cardiac Records From 05/01/09-06/08/12 faxed to Dr.Brian [facility] at [phone]  12/06/12/KM

## 2012-12-07 NOTE — Assessment & Plan Note (Addendum)
No current angina pectoris.  Has LMCA disease, heavily calcified coronaries, and continues to smoke.  He does understand the consequences.

## 2012-12-07 NOTE — Assessment & Plan Note (Signed)
Some elevated today but labile.

## 2012-12-07 NOTE — Assessment & Plan Note (Signed)
Could partially explain dizziness.  Non critical carotid disease.  See reports.

## 2012-12-07 NOTE — Assessment & Plan Note (Signed)
Has not been able to quit.

## 2014-10-23 DIAGNOSIS — H3532 Exudative age-related macular degeneration: Secondary | ICD-10-CM | POA: Diagnosis not present

## 2014-10-25 DIAGNOSIS — J449 Chronic obstructive pulmonary disease, unspecified: Secondary | ICD-10-CM | POA: Diagnosis not present

## 2014-11-25 DIAGNOSIS — J449 Chronic obstructive pulmonary disease, unspecified: Secondary | ICD-10-CM | POA: Diagnosis not present

## 2014-12-03 DIAGNOSIS — J449 Chronic obstructive pulmonary disease, unspecified: Secondary | ICD-10-CM | POA: Diagnosis not present

## 2014-12-04 DIAGNOSIS — H3532 Exudative age-related macular degeneration: Secondary | ICD-10-CM | POA: Diagnosis not present

## 2014-12-24 DIAGNOSIS — J449 Chronic obstructive pulmonary disease, unspecified: Secondary | ICD-10-CM | POA: Diagnosis not present

## 2015-01-19 DIAGNOSIS — J441 Chronic obstructive pulmonary disease with (acute) exacerbation: Secondary | ICD-10-CM | POA: Diagnosis not present

## 2015-01-19 DIAGNOSIS — I10 Essential (primary) hypertension: Secondary | ICD-10-CM | POA: Diagnosis not present

## 2015-01-19 DIAGNOSIS — Z681 Body mass index (BMI) 19 or less, adult: Secondary | ICD-10-CM | POA: Diagnosis not present

## 2015-01-19 DIAGNOSIS — E785 Hyperlipidemia, unspecified: Secondary | ICD-10-CM | POA: Diagnosis not present

## 2015-01-24 DIAGNOSIS — J449 Chronic obstructive pulmonary disease, unspecified: Secondary | ICD-10-CM | POA: Diagnosis not present

## 2015-01-27 DIAGNOSIS — Z681 Body mass index (BMI) 19 or less, adult: Secondary | ICD-10-CM | POA: Diagnosis not present

## 2015-01-27 DIAGNOSIS — E785 Hyperlipidemia, unspecified: Secondary | ICD-10-CM | POA: Diagnosis not present

## 2015-01-27 DIAGNOSIS — E782 Mixed hyperlipidemia: Secondary | ICD-10-CM | POA: Diagnosis not present

## 2015-01-27 DIAGNOSIS — I10 Essential (primary) hypertension: Secondary | ICD-10-CM | POA: Diagnosis not present

## 2015-02-05 DIAGNOSIS — H3532 Exudative age-related macular degeneration: Secondary | ICD-10-CM | POA: Diagnosis not present

## 2015-02-19 DIAGNOSIS — J449 Chronic obstructive pulmonary disease, unspecified: Secondary | ICD-10-CM | POA: Diagnosis not present

## 2015-02-19 DIAGNOSIS — E785 Hyperlipidemia, unspecified: Secondary | ICD-10-CM | POA: Diagnosis not present

## 2015-02-19 DIAGNOSIS — I2510 Atherosclerotic heart disease of native coronary artery without angina pectoris: Secondary | ICD-10-CM | POA: Diagnosis not present

## 2015-02-23 DIAGNOSIS — J449 Chronic obstructive pulmonary disease, unspecified: Secondary | ICD-10-CM | POA: Diagnosis not present

## 2015-03-23 DIAGNOSIS — M79662 Pain in left lower leg: Secondary | ICD-10-CM | POA: Diagnosis not present

## 2015-03-23 DIAGNOSIS — M79605 Pain in left leg: Secondary | ICD-10-CM | POA: Diagnosis not present

## 2015-03-23 DIAGNOSIS — K5900 Constipation, unspecified: Secondary | ICD-10-CM | POA: Diagnosis not present

## 2015-03-23 DIAGNOSIS — Z87891 Personal history of nicotine dependence: Secondary | ICD-10-CM | POA: Diagnosis not present

## 2015-03-23 DIAGNOSIS — Z681 Body mass index (BMI) 19 or less, adult: Secondary | ICD-10-CM | POA: Diagnosis not present

## 2015-03-24 ENCOUNTER — Emergency Department (HOSPITAL_COMMUNITY)
Admission: EM | Admit: 2015-03-24 | Discharge: 2015-03-24 | Disposition: A | Discharge status: Home / Self Care | Payer: Medicare Other | Attending: Emergency Medicine | Admitting: Emergency Medicine

## 2015-03-24 ENCOUNTER — Encounter (HOSPITAL_COMMUNITY): Payer: Self-pay | Admitting: Nurse Practitioner

## 2015-03-24 DIAGNOSIS — Z7982 Long term (current) use of aspirin: Secondary | ICD-10-CM | POA: Diagnosis not present

## 2015-03-24 DIAGNOSIS — Z79899 Other long term (current) drug therapy: Secondary | ICD-10-CM | POA: Diagnosis not present

## 2015-03-24 DIAGNOSIS — J449 Chronic obstructive pulmonary disease, unspecified: Secondary | ICD-10-CM | POA: Insufficient documentation

## 2015-03-24 DIAGNOSIS — Z8673 Personal history of transient ischemic attack (TIA), and cerebral infarction without residual deficits: Secondary | ICD-10-CM | POA: Insufficient documentation

## 2015-03-24 DIAGNOSIS — Z720 Tobacco use: Secondary | ICD-10-CM | POA: Insufficient documentation

## 2015-03-24 DIAGNOSIS — I10 Essential (primary) hypertension: Secondary | ICD-10-CM | POA: Insufficient documentation

## 2015-03-24 DIAGNOSIS — Z85528 Personal history of other malignant neoplasm of kidney: Secondary | ICD-10-CM | POA: Diagnosis not present

## 2015-03-24 DIAGNOSIS — E785 Hyperlipidemia, unspecified: Secondary | ICD-10-CM | POA: Insufficient documentation

## 2015-03-24 DIAGNOSIS — I2510 Atherosclerotic heart disease of native coronary artery without angina pectoris: Secondary | ICD-10-CM | POA: Diagnosis not present

## 2015-03-24 DIAGNOSIS — I83812 Varicose veins of left lower extremities with pain: Secondary | ICD-10-CM | POA: Diagnosis not present

## 2015-03-24 DIAGNOSIS — M79652 Pain in left thigh: Secondary | ICD-10-CM | POA: Diagnosis not present

## 2015-03-24 DIAGNOSIS — Z9889 Other specified postprocedural states: Secondary | ICD-10-CM | POA: Insufficient documentation

## 2015-03-24 DIAGNOSIS — M79662 Pain in left lower leg: Secondary | ICD-10-CM | POA: Diagnosis not present

## 2015-03-24 DIAGNOSIS — I809 Phlebitis and thrombophlebitis of unspecified site: Secondary | ICD-10-CM | POA: Diagnosis not present

## 2015-03-24 DIAGNOSIS — I82412 Acute embolism and thrombosis of left femoral vein: Secondary | ICD-10-CM | POA: Diagnosis not present

## 2015-03-24 DIAGNOSIS — M79605 Pain in left leg: Secondary | ICD-10-CM | POA: Diagnosis not present

## 2015-03-24 MED ORDER — ASPIRIN 325 MG PO TABS
325.0000 mg | ORAL_TABLET | Freq: Once | ORAL | Status: AC
  Administered 2015-03-24: 325 mg via ORAL
  Filled 2015-03-24: qty 1

## 2015-03-24 NOTE — ED Provider Notes (Addendum)
CSN: [id number]     Arrival date & time 03/24/15  1659 History   First MD Initiated Contact with Patient 03/24/15 1822     Chief Complaint  Patient presents with  . Leg Pain     (Consider location/radiation/quality/duration/timing/severity/associated sxs/prior Treatment) HPI  complained of left posterior medial thigh pain yesterday which resolved today. He is presently asymptomatic. Nothing made pain better or worse. 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 No evidence of DVT. Patient is presently asymptomatic without treatment Past Medical History  Diagnosis Date  . CAD (coronary artery disease)   . PVD (peripheral vascular disease)   . HTN (hypertension)   . Hyperlipidemia   . History of abdominal aortic aneurysm repair   . Cerebrovascular disease   . Renal cell cancer   . COPD (chronic obstructive pulmonary disease)    Past Surgical History  Procedure Laterality Date  . Nephrectomy    . Abdominal aortic aneurysm repair  2000   Family History  Problem Relation Age of Onset  . Heart disease    . Cancer Mother     deceased  . Heart attack Father     deceased  . Cancer Father     deceased  . Heart attack Brother 9    deceased   History  Substance Use Topics  . Smoking status: Current Every Day Smoker  . Smokeless tobacco: Not on file  . Alcohol Use: Not on file    Review of Systems  Constitutional: Negative.   HENT: Negative.   Respiratory: Negative.   Cardiovascular: Negative.   Gastrointestinal: Negative.   Musculoskeletal: Positive for myalgias.       Left thigh pain-resolved  Skin: Negative.   Neurological: Negative.   Psychiatric/Behavioral: Negative.   All other systems reviewed and are negative.     Allergies  Review of patient's allergies indicates no known allergies.  Home Medications   Prior to Admission medications   Medication  Sig Start Date End Date Taking? Authorizing Provider  amitriptyline (ELAVIL) 25 MG tablet Take 25 mg by mouth at bedtime.      Historical Provider, MD  aspirin 81 MG tablet Take 81 mg by mouth 2 (two) times daily.      Historical Provider, MD  fish oil-omega-3 fatty acids 1000 MG capsule Take 1 g by mouth 2 (two) times daily.      Historical Provider, MD  HORSE CHESTNUT PO Take by mouth every morning.    Historical Provider, MD  Multiple Vitamin (MULTIVITAMIN) capsule Take 1 capsule by mouth daily.      Historical Provider, MD  Multiple Vitamins-Minerals (EYE VITAMINS PO) Take 1 capsule by mouth 2 (two) times daily.      Historical Provider, MD  NORVASC 5 MG tablet TAKE 1&1/2 TABLETS EVERY OTHER DAY. 02/14/11   Hillary Bow, MD  omeprazole (PRILOSEC) 20 MG capsule Take 1 tablet by mouth Daily. 05/25/12   Historical Provider, MD  PROBIOTIC CAPS Take 1 capsule by mouth daily.      Historical Provider, MD  Red Yeast Rice 600 MG CAPS Take 1 capsule by mouth daily.      Historical Provider, MD  sertraline (ZOLOFT) 25 MG tablet Take 0.5 tablets by mouth Daily. 06/05/12   Historical Provider, MD  simvastatin (ZOCOR) 40 MG tablet Take 20 mg by mouth as directed.     Historical Provider, MD  zolpidem (AMBIEN) 5 MG tablet  Take 5 mg by mouth at bedtime as needed.      Historical Provider, MD   BP 208/83 mmHg  Pulse 65  Temp(Src) 98.1 F (36.7 C) (Oral)  Resp 18  Ht 5\' 11"  (1.803 m)  Wt 132 lb (59.875 kg)  BMI 18.42 kg/m2  SpO2 92% Physical Exam  Constitutional:  Cachectic chronically ill-appearing  HENT:  Head: Normocephalic and atraumatic.  Eyes: Conjunctivae are normal. Pupils are equal, round, and reactive to light.  Neck: Neck supple. No tracheal deviation present. No thyromegaly present.  Cardiovascular: Normal rate and regular rhythm.   No murmur heard. Pulmonary/Chest: Effort normal and breath sounds normal.  Abdominal: Soft. Bowel sounds are normal. He exhibits no distension. There is  no tenderness.  Musculoskeletal: Normal range of motion. He exhibits no edema or tenderness.  Neurological: He is alert. Coordination normal.  Skin: Skin is warm and dry. No rash noted.  Psychiatric: He has a normal mood and affect.  Nursing note and vitals reviewed.   ED Course  Procedures (including critical care time) Labs Review Labs Reviewed - No data to display  Imaging Review No results found.   EKG Interpretation None      MDM  Spoke with Dr. Scot Dock, vascular surgeon who suggested patient does not need anticoagulation other than aspirin 325 g daily. Warm compresses if symptomatic. He should follow up with his primary care physician. Blood pressure recheck one week. Counseled patient for 5 minutes on smoking cessation Final diagnoses:  None   Dx #1 phlebitis left thigh #2 hypertension #3 tobacco abuse     Orlie Dakin, MD 03/24/15 2013  Orlie Dakin, MD 03/24/15 2015

## 2015-03-24 NOTE — ED Notes (Signed)
Provider informed of discharging vitals, verified pt to be discharged.

## 2015-03-24 NOTE — Discharge Instructions (Signed)
Phlebitis Take 1 aspirin 325 mg each day. Get your blood pressure rechecked by your primary care physician in one week. Today's blood pressure was elevated at 192/82. Ask your primary care physician to help you to stop smoking. Phlebitis is soreness and swelling (inflammation) of a vein. This can occur in your arms, legs, or torso (trunk), as well as deeper inside your body. Phlebitis is usually not serious when it occurs close to the surface of the body. However, it can cause serious problems when it occurs in a vein deeper inside the body. CAUSES  Phlebitis can be triggered by various things, including:   Reduced blood flow through your veins. This can happen with:  Bed rest over a long period.  Long-distance travel.  Injury.  Surgery.  Being overweight (obese) or pregnant.  Having an IV tube put in the vein and getting certain medicines through the vein.  Cancer and cancer treatment.  Use of illegal drugs taken through the vein.  Inflammatory diseases.  Inherited (genetic) diseases that increase the risk of blood clots.  Hormone therapy, such as birth control pills. SIGNS AND SYMPTOMS   Red, tender, swollen, and painful area on your skin. Usually, the area will be long and narrow.  Firmness along the center of the affected area. This can indicate that a blood clot has formed.  Low-grade fever. DIAGNOSIS  A health care provider can usually diagnose phlebitis by examining the affected area and asking about your symptoms. To check for infection or blood clots, your health care provider may order blood tests or an ultrasound exam of the area. Blood tests and your family history may also indicate if you have an underlying genetic disease that causes blood clots. Occasionally, a piece of tissue is taken from the body (biopsy sample) if an unusual cause of phlebitis is suspected. TREATMENT  Treatment will vary depending on the severity of the condition and the area of the body  affected. Treatment may include:  Use of a warm compress or heating pad.  Use of compression stockings or bandages.  Anti-inflammatory medicines.  Removal of any IV tube that may be causing the problem.  Medicines that kill germs (antibiotics) if an infection is present.  Blood-thinning medicines if a blood clot is suspected or present.  In rare cases, surgery may be needed to remove damaged sections of vein. HOME CARE INSTRUCTIONS   Only take over-the-counter or prescription medicines as directed by your health care provider. Take all medicines exactly as prescribed.  Raise (elevate) the affected area above the level of your heart as directed by your health care provider.  Apply a warm compress or heating pad to the affected area as directed by your health care provider. Do not sleep with the heating pad.  Use compression stockings or bandages as directed. These will speed healing and prevent the condition from coming back.  If you are on blood thinners:  Get follow-up blood tests as directed by your health care provider.  Check with your health care provider before using any new medicines.  Carry a medical alert card or wear your medical alert jewelry to show that you are on blood thinners.  For phlebitis in the legs:  Avoid prolonged standing or bed rest.  Keep your legs moving. Raise your legs when sitting or lying.  Do not smoke.  Women, particularly those over the age of 23, should consider the risks and benefits of taking the contraceptive pill. This kind of hormone treatment can increase  your risk for blood clots.  Follow up with your health care provider as directed. SEEK MEDICAL CARE IF:   You have unusual bruising or any bleeding problems.  Your swelling or pain in the affected area is not improving.  You are on anti-inflammatory medicine, and you develop belly (abdominal) pain. SEEK IMMEDIATE MEDICAL CARE IF:   You have a sudden onset of chest pain or  difficulty breathing.  You have a fever or persistent symptoms for more than 2-3 days.  You have a fever and your symptoms suddenly get worse. MAKE SURE YOU:  Understand these instructions.  Will watch your condition.  Will get help right away if you are not doing well or get worse. Document Released: 09/13/2001 Document Revised: 07/10/2013 Document Reviewed: 05/27/2013 [facility] Patient Information 2015 [place], [place]. This information is not intended to replace advice given to you by your health care provider. Make sure you discuss any questions you have with your health care provider.

## 2015-03-24 NOTE — ED Notes (Signed)
He was sent from doctor for management of L venous thrombosis in femoral vein found on doppler study today. He denies pain but he was having L leg pain yesterday. A&Ox4, resp e/u

## 2015-03-26 DIAGNOSIS — J449 Chronic obstructive pulmonary disease, unspecified: Secondary | ICD-10-CM | POA: Diagnosis not present

## 2015-03-30 DIAGNOSIS — I82402 Acute embolism and thrombosis of unspecified deep veins of left lower extremity: Secondary | ICD-10-CM | POA: Diagnosis not present

## 2015-03-30 DIAGNOSIS — Z681 Body mass index (BMI) 19 or less, adult: Secondary | ICD-10-CM | POA: Diagnosis not present

## 2015-04-09 DIAGNOSIS — H3532 Exudative age-related macular degeneration: Secondary | ICD-10-CM | POA: Diagnosis not present

## 2015-04-14 DIAGNOSIS — L03115 Cellulitis of right lower limb: Secondary | ICD-10-CM | POA: Diagnosis not present

## 2015-04-14 DIAGNOSIS — Z681 Body mass index (BMI) 19 or less, adult: Secondary | ICD-10-CM | POA: Diagnosis not present

## 2015-04-14 DIAGNOSIS — W57XXXA Bitten or stung by nonvenomous insect and other nonvenomous arthropods, initial encounter: Secondary | ICD-10-CM | POA: Diagnosis not present

## 2015-04-15 DIAGNOSIS — I714 Abdominal aortic aneurysm, without rupture: Secondary | ICD-10-CM | POA: Diagnosis not present

## 2015-04-15 DIAGNOSIS — I724 Aneurysm of artery of lower extremity: Secondary | ICD-10-CM | POA: Diagnosis not present

## 2015-04-25 DIAGNOSIS — J449 Chronic obstructive pulmonary disease, unspecified: Secondary | ICD-10-CM | POA: Diagnosis not present

## 2015-05-26 DIAGNOSIS — J449 Chronic obstructive pulmonary disease, unspecified: Secondary | ICD-10-CM | POA: Diagnosis not present

## 2015-05-28 DIAGNOSIS — R634 Abnormal weight loss: Secondary | ICD-10-CM | POA: Diagnosis not present

## 2015-05-28 DIAGNOSIS — J449 Chronic obstructive pulmonary disease, unspecified: Secondary | ICD-10-CM | POA: Diagnosis not present

## 2015-05-28 DIAGNOSIS — Z681 Body mass index (BMI) 19 or less, adult: Secondary | ICD-10-CM | POA: Diagnosis not present

## 2015-05-28 DIAGNOSIS — K5900 Constipation, unspecified: Secondary | ICD-10-CM | POA: Diagnosis not present

## 2015-06-04 DIAGNOSIS — J441 Chronic obstructive pulmonary disease with (acute) exacerbation: Secondary | ICD-10-CM | POA: Diagnosis not present

## 2015-06-04 DIAGNOSIS — Z681 Body mass index (BMI) 19 or less, adult: Secondary | ICD-10-CM | POA: Diagnosis not present

## 2015-06-26 DIAGNOSIS — J449 Chronic obstructive pulmonary disease, unspecified: Secondary | ICD-10-CM | POA: Diagnosis not present

## 2015-07-10 DIAGNOSIS — H353232 Exudative age-related macular degeneration, bilateral, with inactive choroidal neovascularization: Secondary | ICD-10-CM | POA: Diagnosis not present

## 2015-07-26 DIAGNOSIS — J449 Chronic obstructive pulmonary disease, unspecified: Secondary | ICD-10-CM | POA: Diagnosis not present

## 2015-08-13 DIAGNOSIS — I723 Aneurysm of iliac artery: Secondary | ICD-10-CM | POA: Diagnosis not present

## 2015-08-13 DIAGNOSIS — E785 Hyperlipidemia, unspecified: Secondary | ICD-10-CM | POA: Diagnosis not present

## 2015-08-13 DIAGNOSIS — I10 Essential (primary) hypertension: Secondary | ICD-10-CM | POA: Diagnosis not present

## 2015-08-13 DIAGNOSIS — I724 Aneurysm of artery of lower extremity: Secondary | ICD-10-CM | POA: Diagnosis not present

## 2015-08-13 DIAGNOSIS — I25118 Atherosclerotic heart disease of native coronary artery with other forms of angina pectoris: Secondary | ICD-10-CM | POA: Diagnosis not present

## 2015-08-25 DIAGNOSIS — I25118 Atherosclerotic heart disease of native coronary artery with other forms of angina pectoris: Secondary | ICD-10-CM | POA: Diagnosis not present

## 2015-08-25 DIAGNOSIS — I724 Aneurysm of artery of lower extremity: Secondary | ICD-10-CM | POA: Diagnosis not present

## 2015-08-25 DIAGNOSIS — I10 Essential (primary) hypertension: Secondary | ICD-10-CM | POA: Diagnosis not present

## 2015-08-25 DIAGNOSIS — E785 Hyperlipidemia, unspecified: Secondary | ICD-10-CM | POA: Diagnosis not present

## 2015-08-25 DIAGNOSIS — I723 Aneurysm of iliac artery: Secondary | ICD-10-CM | POA: Diagnosis not present

## 2015-08-26 DIAGNOSIS — Z681 Body mass index (BMI) 19 or less, adult: Secondary | ICD-10-CM | POA: Diagnosis not present

## 2015-08-26 DIAGNOSIS — J449 Chronic obstructive pulmonary disease, unspecified: Secondary | ICD-10-CM | POA: Diagnosis not present

## 2015-08-26 DIAGNOSIS — J441 Chronic obstructive pulmonary disease with (acute) exacerbation: Secondary | ICD-10-CM | POA: Diagnosis not present

## 2015-09-25 DIAGNOSIS — J449 Chronic obstructive pulmonary disease, unspecified: Secondary | ICD-10-CM | POA: Diagnosis not present

## 2015-10-21 DIAGNOSIS — Z0000 Encounter for general adult medical examination without abnormal findings: Secondary | ICD-10-CM | POA: Diagnosis not present

## 2015-10-21 DIAGNOSIS — Z125 Encounter for screening for malignant neoplasm of prostate: Secondary | ICD-10-CM | POA: Diagnosis not present

## 2015-10-21 DIAGNOSIS — Z681 Body mass index (BMI) 19 or less, adult: Secondary | ICD-10-CM | POA: Diagnosis not present

## 2015-10-21 DIAGNOSIS — J449 Chronic obstructive pulmonary disease, unspecified: Secondary | ICD-10-CM | POA: Diagnosis not present

## 2015-10-21 DIAGNOSIS — I10 Essential (primary) hypertension: Secondary | ICD-10-CM | POA: Diagnosis not present

## 2015-10-21 DIAGNOSIS — N400 Enlarged prostate without lower urinary tract symptoms: Secondary | ICD-10-CM | POA: Diagnosis not present

## 2015-10-21 DIAGNOSIS — E785 Hyperlipidemia, unspecified: Secondary | ICD-10-CM | POA: Diagnosis not present

## 2015-10-26 DIAGNOSIS — J449 Chronic obstructive pulmonary disease, unspecified: Secondary | ICD-10-CM | POA: Diagnosis not present

## 2015-10-27 DIAGNOSIS — Z23 Encounter for immunization: Secondary | ICD-10-CM | POA: Diagnosis not present

## 2015-10-29 DIAGNOSIS — H353232 Exudative age-related macular degeneration, bilateral, with inactive choroidal neovascularization: Secondary | ICD-10-CM | POA: Diagnosis not present

## 2015-11-16 DIAGNOSIS — D0439 Carcinoma in situ of skin of other parts of face: Secondary | ICD-10-CM | POA: Diagnosis not present

## 2015-11-16 DIAGNOSIS — L728 Other follicular cysts of the skin and subcutaneous tissue: Secondary | ICD-10-CM | POA: Diagnosis not present

## 2015-11-16 DIAGNOSIS — C44329 Squamous cell carcinoma of skin of other parts of face: Secondary | ICD-10-CM | POA: Diagnosis not present

## 2015-11-16 DIAGNOSIS — L578 Other skin changes due to chronic exposure to nonionizing radiation: Secondary | ICD-10-CM | POA: Diagnosis not present

## 2015-11-26 DIAGNOSIS — J449 Chronic obstructive pulmonary disease, unspecified: Secondary | ICD-10-CM | POA: Diagnosis not present

## 2015-12-24 DIAGNOSIS — J449 Chronic obstructive pulmonary disease, unspecified: Secondary | ICD-10-CM | POA: Diagnosis not present

## 2016-01-20 DIAGNOSIS — H353232 Exudative age-related macular degeneration, bilateral, with inactive choroidal neovascularization: Secondary | ICD-10-CM | POA: Diagnosis not present

## 2016-01-24 DIAGNOSIS — J449 Chronic obstructive pulmonary disease, unspecified: Secondary | ICD-10-CM | POA: Diagnosis not present

## 2016-02-23 DIAGNOSIS — J449 Chronic obstructive pulmonary disease, unspecified: Secondary | ICD-10-CM | POA: Diagnosis not present

## 2016-02-25 DIAGNOSIS — I10 Essential (primary) hypertension: Secondary | ICD-10-CM | POA: Diagnosis not present

## 2016-02-25 DIAGNOSIS — E785 Hyperlipidemia, unspecified: Secondary | ICD-10-CM | POA: Diagnosis not present

## 2016-02-25 DIAGNOSIS — I2510 Atherosclerotic heart disease of native coronary artery without angina pectoris: Secondary | ICD-10-CM | POA: Diagnosis not present

## 2016-02-25 DIAGNOSIS — R634 Abnormal weight loss: Secondary | ICD-10-CM | POA: Diagnosis not present

## 2016-02-25 DIAGNOSIS — J9610 Chronic respiratory failure, unspecified whether with hypoxia or hypercapnia: Secondary | ICD-10-CM | POA: Diagnosis not present

## 2016-02-25 DIAGNOSIS — N183 Chronic kidney disease, stage 3 (moderate): Secondary | ICD-10-CM | POA: Diagnosis not present

## 2016-03-25 DIAGNOSIS — J449 Chronic obstructive pulmonary disease, unspecified: Secondary | ICD-10-CM | POA: Diagnosis not present

## 2016-04-24 DIAGNOSIS — J449 Chronic obstructive pulmonary disease, unspecified: Secondary | ICD-10-CM | POA: Diagnosis not present

## 2016-05-25 DIAGNOSIS — J449 Chronic obstructive pulmonary disease, unspecified: Secondary | ICD-10-CM | POA: Diagnosis not present

## 2016-05-27 DIAGNOSIS — H353232 Exudative age-related macular degeneration, bilateral, with inactive choroidal neovascularization: Secondary | ICD-10-CM | POA: Diagnosis not present

## 2016-06-25 DIAGNOSIS — J449 Chronic obstructive pulmonary disease, unspecified: Secondary | ICD-10-CM | POA: Diagnosis not present

## 2016-06-28 DIAGNOSIS — J9610 Chronic respiratory failure, unspecified whether with hypoxia or hypercapnia: Secondary | ICD-10-CM | POA: Diagnosis not present

## 2016-06-28 DIAGNOSIS — J449 Chronic obstructive pulmonary disease, unspecified: Secondary | ICD-10-CM | POA: Diagnosis not present

## 2016-06-28 DIAGNOSIS — Z23 Encounter for immunization: Secondary | ICD-10-CM | POA: Diagnosis not present

## 2016-06-28 DIAGNOSIS — I10 Essential (primary) hypertension: Secondary | ICD-10-CM | POA: Diagnosis not present

## 2016-06-28 DIAGNOSIS — E785 Hyperlipidemia, unspecified: Secondary | ICD-10-CM | POA: Diagnosis not present

## 2016-07-05 DIAGNOSIS — E875 Hyperkalemia: Secondary | ICD-10-CM | POA: Diagnosis not present

## 2016-07-05 DIAGNOSIS — Z0000 Encounter for general adult medical examination without abnormal findings: Secondary | ICD-10-CM | POA: Diagnosis not present

## 2016-07-22 DIAGNOSIS — J449 Chronic obstructive pulmonary disease, unspecified: Secondary | ICD-10-CM | POA: Diagnosis not present

## 2016-07-22 DIAGNOSIS — J9610 Chronic respiratory failure, unspecified whether with hypoxia or hypercapnia: Secondary | ICD-10-CM | POA: Diagnosis not present

## 2016-07-25 DIAGNOSIS — J449 Chronic obstructive pulmonary disease, unspecified: Secondary | ICD-10-CM | POA: Diagnosis not present

## 2016-08-24 DIAGNOSIS — I723 Aneurysm of iliac artery: Secondary | ICD-10-CM | POA: Diagnosis not present

## 2016-08-24 DIAGNOSIS — I724 Aneurysm of artery of lower extremity: Secondary | ICD-10-CM | POA: Diagnosis not present

## 2016-08-24 DIAGNOSIS — I25118 Atherosclerotic heart disease of native coronary artery with other forms of angina pectoris: Secondary | ICD-10-CM | POA: Diagnosis not present

## 2016-08-24 DIAGNOSIS — I10 Essential (primary) hypertension: Secondary | ICD-10-CM | POA: Diagnosis not present

## 2016-08-24 DIAGNOSIS — E785 Hyperlipidemia, unspecified: Secondary | ICD-10-CM | POA: Diagnosis not present

## 2016-08-25 DIAGNOSIS — J449 Chronic obstructive pulmonary disease, unspecified: Secondary | ICD-10-CM | POA: Diagnosis not present

## 2016-09-23 ENCOUNTER — Other Ambulatory Visit: Payer: Self-pay | Admitting: Pharmacist

## 2016-09-23 NOTE — Patient Outreach (Signed)
Outreach call to Craig Berry regarding medication adherence to rosuvastatin. Left a HIPAA compliant message on the patient's voicemail.  Harlow Asa, PharmD, [facility] [phone]

## 2017-02-10 DIAGNOSIS — J441 Chronic obstructive pulmonary disease with (acute) exacerbation: Secondary | ICD-10-CM | POA: Diagnosis not present

## 2017-02-12 ENCOUNTER — Emergency Department (HOSPITAL_COMMUNITY): Payer: Medicare Other

## 2017-02-12 ENCOUNTER — Encounter (HOSPITAL_COMMUNITY): Payer: Self-pay | Admitting: Emergency Medicine

## 2017-02-12 ENCOUNTER — Inpatient Hospital Stay (HOSPITAL_COMMUNITY)
Admission: EM | Admit: 2017-02-12 | Discharge: 2017-02-16 | DRG: 280 | Disposition: A | Discharge status: Home Health | Payer: Medicare Other | Attending: Internal Medicine | Admitting: Internal Medicine

## 2017-02-12 DIAGNOSIS — J9621 Acute and chronic respiratory failure with hypoxia: Secondary | ICD-10-CM | POA: Diagnosis present

## 2017-02-12 DIAGNOSIS — E785 Hyperlipidemia, unspecified: Secondary | ICD-10-CM

## 2017-02-12 DIAGNOSIS — N183 Chronic kidney disease, stage 3 unspecified: Secondary | ICD-10-CM | POA: Diagnosis present

## 2017-02-12 DIAGNOSIS — Z7982 Long term (current) use of aspirin: Secondary | ICD-10-CM | POA: Diagnosis not present

## 2017-02-12 DIAGNOSIS — F17200 Nicotine dependence, unspecified, uncomplicated: Secondary | ICD-10-CM | POA: Diagnosis present

## 2017-02-12 DIAGNOSIS — J9600 Acute respiratory failure, unspecified whether with hypoxia or hypercapnia: Secondary | ICD-10-CM | POA: Diagnosis not present

## 2017-02-12 DIAGNOSIS — I255 Ischemic cardiomyopathy: Secondary | ICD-10-CM | POA: Diagnosis not present

## 2017-02-12 DIAGNOSIS — F17210 Nicotine dependence, cigarettes, uncomplicated: Secondary | ICD-10-CM | POA: Diagnosis present

## 2017-02-12 DIAGNOSIS — Z79899 Other long term (current) drug therapy: Secondary | ICD-10-CM

## 2017-02-12 DIAGNOSIS — I2541 Coronary artery aneurysm: Secondary | ICD-10-CM | POA: Diagnosis not present

## 2017-02-12 DIAGNOSIS — F0390 Unspecified dementia without behavioral disturbance: Secondary | ICD-10-CM | POA: Diagnosis present

## 2017-02-12 DIAGNOSIS — I5043 Acute on chronic combined systolic (congestive) and diastolic (congestive) heart failure: Secondary | ICD-10-CM | POA: Diagnosis present

## 2017-02-12 DIAGNOSIS — I2510 Atherosclerotic heart disease of native coronary artery without angina pectoris: Secondary | ICD-10-CM | POA: Diagnosis present

## 2017-02-12 DIAGNOSIS — I2583 Coronary atherosclerosis due to lipid rich plaque: Secondary | ICD-10-CM

## 2017-02-12 DIAGNOSIS — J449 Chronic obstructive pulmonary disease, unspecified: Secondary | ICD-10-CM | POA: Diagnosis not present

## 2017-02-12 DIAGNOSIS — R6510 Systemic inflammatory response syndrome (SIRS) of non-infectious origin without acute organ dysfunction: Secondary | ICD-10-CM | POA: Diagnosis not present

## 2017-02-12 DIAGNOSIS — J9601 Acute respiratory failure with hypoxia: Secondary | ICD-10-CM

## 2017-02-12 DIAGNOSIS — R5381 Other malaise: Secondary | ICD-10-CM | POA: Diagnosis present

## 2017-02-12 DIAGNOSIS — I2584 Coronary atherosclerosis due to calcified coronary lesion: Secondary | ICD-10-CM | POA: Diagnosis not present

## 2017-02-12 DIAGNOSIS — I10 Essential (primary) hypertension: Secondary | ICD-10-CM

## 2017-02-12 DIAGNOSIS — E784 Other hyperlipidemia: Secondary | ICD-10-CM | POA: Diagnosis not present

## 2017-02-12 DIAGNOSIS — I25110 Atherosclerotic heart disease of native coronary artery with unstable angina pectoris: Secondary | ICD-10-CM | POA: Diagnosis not present

## 2017-02-12 DIAGNOSIS — J441 Chronic obstructive pulmonary disease with (acute) exacerbation: Secondary | ICD-10-CM | POA: Diagnosis not present

## 2017-02-12 DIAGNOSIS — I214 Non-ST elevation (NSTEMI) myocardial infarction: Principal | ICD-10-CM | POA: Diagnosis present

## 2017-02-12 DIAGNOSIS — Z9981 Dependence on supplemental oxygen: Secondary | ICD-10-CM | POA: Diagnosis not present

## 2017-02-12 DIAGNOSIS — I739 Peripheral vascular disease, unspecified: Secondary | ICD-10-CM | POA: Diagnosis present

## 2017-02-12 DIAGNOSIS — D649 Anemia, unspecified: Secondary | ICD-10-CM | POA: Diagnosis present

## 2017-02-12 DIAGNOSIS — M79603 Pain in arm, unspecified: Secondary | ICD-10-CM | POA: Diagnosis not present

## 2017-02-12 DIAGNOSIS — I248 Other forms of acute ischemic heart disease: Secondary | ICD-10-CM | POA: Diagnosis not present

## 2017-02-12 DIAGNOSIS — Z7902 Long term (current) use of antithrombotics/antiplatelets: Secondary | ICD-10-CM

## 2017-02-12 DIAGNOSIS — E43 Unspecified severe protein-calorie malnutrition: Secondary | ICD-10-CM | POA: Diagnosis not present

## 2017-02-12 DIAGNOSIS — J9602 Acute respiratory failure with hypercapnia: Secondary | ICD-10-CM

## 2017-02-12 DIAGNOSIS — Z791 Long term (current) use of non-steroidal anti-inflammatories (NSAID): Secondary | ICD-10-CM | POA: Diagnosis not present

## 2017-02-12 DIAGNOSIS — Z681 Body mass index (BMI) 19 or less, adult: Secondary | ICD-10-CM

## 2017-02-12 DIAGNOSIS — I081 Rheumatic disorders of both mitral and tricuspid valves: Secondary | ICD-10-CM | POA: Diagnosis present

## 2017-02-12 DIAGNOSIS — Z66 Do not resuscitate: Secondary | ICD-10-CM | POA: Diagnosis not present

## 2017-02-12 DIAGNOSIS — Z85528 Personal history of other malignant neoplasm of kidney: Secondary | ICD-10-CM

## 2017-02-12 DIAGNOSIS — I130 Hypertensive heart and chronic kidney disease with heart failure and stage 1 through stage 4 chronic kidney disease, or unspecified chronic kidney disease: Secondary | ICD-10-CM | POA: Diagnosis present

## 2017-02-12 DIAGNOSIS — I25118 Atherosclerotic heart disease of native coronary artery with other forms of angina pectoris: Secondary | ICD-10-CM | POA: Diagnosis not present

## 2017-02-12 DIAGNOSIS — R627 Adult failure to thrive: Secondary | ICD-10-CM | POA: Diagnosis present

## 2017-02-12 DIAGNOSIS — I360 Nonrheumatic tricuspid (valve) stenosis: Secondary | ICD-10-CM | POA: Diagnosis not present

## 2017-02-12 DIAGNOSIS — I21A1 Myocardial infarction type 2: Secondary | ICD-10-CM | POA: Diagnosis present

## 2017-02-12 DIAGNOSIS — R0602 Shortness of breath: Secondary | ICD-10-CM | POA: Diagnosis not present

## 2017-02-12 HISTORY — DX: Aneurysm of artery of lower extremity: I72.4

## 2017-02-12 HISTORY — DX: Abdominal aortic aneurysm, without rupture, unspecified: I71.40

## 2017-02-12 HISTORY — DX: Abdominal aortic aneurysm, without rupture: I71.4

## 2017-02-12 LAB — CBC WITH DIFFERENTIAL/PLATELET
BASOS PCT: 0 %
Basophils Absolute: 0 10*3/uL (ref 0.0–0.1)
EOS ABS: 0 10*3/uL (ref 0.0–0.7)
Eosinophils Relative: 0 %
HCT: 38.3 % — ABNORMAL LOW (ref 39.0–52.0)
Hemoglobin: 12.2 g/dL — ABNORMAL LOW (ref 13.0–17.0)
Lymphocytes Relative: 8 %
Lymphs Abs: 1 10*3/uL (ref 0.7–4.0)
MCH: 30.8 pg (ref 26.0–34.0)
MCHC: 31.9 g/dL (ref 30.0–36.0)
MCV: 96.7 fL (ref 78.0–100.0)
MONO ABS: 1.3 10*3/uL — AB (ref 0.1–1.0)
Monocytes Relative: 10 %
NEUTROS PCT: 82 %
Neutro Abs: 10.6 10*3/uL — ABNORMAL HIGH (ref 1.7–7.7)
Platelets: 434 10*3/uL — ABNORMAL HIGH (ref 150–400)
RBC: 3.96 MIL/uL — ABNORMAL LOW (ref 4.22–5.81)
RDW: 14.3 % (ref 11.5–15.5)
WBC: 13 10*3/uL — ABNORMAL HIGH (ref 4.0–10.5)

## 2017-02-12 LAB — I-STAT VENOUS BLOOD GAS, ED
ACID-BASE DEFICIT: 2 mmol/L (ref 0.0–2.0)
BICARBONATE: 25.4 mmol/L (ref 20.0–28.0)
O2 SAT: 66 %
TCO2: 27 mmol/L (ref 0–100)
pCO2, Ven: 52.6 mmHg (ref 44.0–60.0)
pH, Ven: 7.292 (ref 7.250–7.430)
pO2, Ven: 39 mmHg (ref 32.0–45.0)

## 2017-02-12 LAB — I-STAT TROPONIN, ED: TROPONIN I, POC: 1.54 ng/mL — AB (ref 0.00–0.08)

## 2017-02-12 LAB — BASIC METABOLIC PANEL
ANION GAP: 10 (ref 5–15)
BUN: 27 mg/dL — ABNORMAL HIGH (ref 6–20)
CALCIUM: 9.8 mg/dL (ref 8.9–10.3)
CO2: 25 mmol/L (ref 22–32)
CREATININE: 1.89 mg/dL — AB (ref 0.61–1.24)
Chloride: 101 mmol/L (ref 101–111)
GFR calc Af Amer: 35 mL/min — ABNORMAL LOW (ref >60)
GFR, EST NON AFRICAN AMERICAN: 30 mL/min — AB (ref >60)
GLUCOSE: 143 mg/dL — AB (ref 65–99)
POTASSIUM: 4.9 mmol/L (ref 3.5–5.1)
SODIUM: 136 mmol/L (ref 135–145)

## 2017-02-12 LAB — BRAIN NATRIURETIC PEPTIDE: B Natriuretic Peptide: 635.3 pg/mL — ABNORMAL HIGH (ref 0.0–100.0)

## 2017-02-12 MED ORDER — ACETAMINOPHEN 325 MG PO TABS: 650.0000 mg | ORAL_TABLET | Freq: Four times a day (QID) | ORAL | Status: DC | PRN

## 2017-02-12 MED ORDER — LEVOFLOXACIN IN D5W 750 MG/150ML IV SOLN
750.0000 mg | INTRAVENOUS | Status: DC
  Administered 2017-02-12: 750 mg via INTRAVENOUS
  Filled 2017-02-12: qty 150

## 2017-02-12 MED ORDER — METOPROLOL TARTRATE 25 MG PO TABS
25.0000 mg | ORAL_TABLET | Freq: Two times a day (BID) | ORAL | Status: DC
  Administered 2017-02-13 – 2017-02-14 (×4): 25 mg via ORAL
  Filled 2017-02-12 (×5): qty 1

## 2017-02-12 MED ORDER — HEPARIN BOLUS VIA INFUSION
2000.0000 [IU] | Freq: Once | INTRAVENOUS | Status: AC
  Administered 2017-02-12: 2000 [IU] via INTRAVENOUS
  Filled 2017-02-12: qty 2000

## 2017-02-12 MED ORDER — NICOTINE 21 MG/24HR TD PT24
21.0000 mg | MEDICATED_PATCH | Freq: Every day | TRANSDERMAL | Status: DC
Start: 2017-02-13 — End: 2017-02-12

## 2017-02-12 MED ORDER — ARFORMOTEROL TARTRATE 15 MCG/2ML IN NEBU
15.0000 ug | INHALATION_SOLUTION | Freq: Two times a day (BID) | RESPIRATORY_TRACT | Status: DC
  Administered 2017-02-13 – 2017-02-16 (×8): 15 ug via RESPIRATORY_TRACT
  Filled 2017-02-12 (×10): qty 2

## 2017-02-12 MED ORDER — CLOPIDOGREL BISULFATE 75 MG PO TABS
75.0000 mg | ORAL_TABLET | Freq: Every day | ORAL | Status: DC
  Administered 2017-02-13: 75 mg via ORAL
  Filled 2017-02-12: qty 1

## 2017-02-12 MED ORDER — METHYLPREDNISOLONE SODIUM SUCC 125 MG IJ SOLR
125.0000 mg | Freq: Once | INTRAMUSCULAR | Status: AC
  Administered 2017-02-12: 125 mg via INTRAVENOUS
  Filled 2017-02-12: qty 2

## 2017-02-12 MED ORDER — ATORVASTATIN CALCIUM 80 MG PO TABS: 80.0000 mg | ORAL_TABLET | Freq: Every day | ORAL | Status: DC

## 2017-02-12 MED ORDER — METHYLPREDNISOLONE SODIUM SUCC 125 MG IJ SOLR
60.0000 mg | Freq: Three times a day (TID) | INTRAMUSCULAR | Status: DC
  Administered 2017-02-13 – 2017-02-15 (×7): 60 mg via INTRAVENOUS
  Filled 2017-02-12 (×7): qty 2

## 2017-02-12 MED ORDER — IPRATROPIUM BROMIDE 0.02 % IN SOLN
0.5000 mg | Freq: Once | RESPIRATORY_TRACT | Status: AC
  Administered 2017-02-12: 0.5 mg via RESPIRATORY_TRACT
  Filled 2017-02-12: qty 2.5

## 2017-02-12 MED ORDER — SODIUM CHLORIDE 0.9 % IV SOLN: INTRAVENOUS | Status: DC

## 2017-02-12 MED ORDER — IPRATROPIUM BROMIDE 0.02 % IN SOLN: 0.5000 mg | RESPIRATORY_TRACT | Status: DC | PRN

## 2017-02-12 MED ORDER — BUDESONIDE 0.5 MG/2ML IN SUSP
0.5000 mg | Freq: Two times a day (BID) | RESPIRATORY_TRACT | Status: DC
  Administered 2017-02-13 – 2017-02-16 (×8): 0.5 mg via RESPIRATORY_TRACT
  Filled 2017-02-12 (×11): qty 2

## 2017-02-12 MED ORDER — DIPHENHYDRAMINE HCL 50 MG/ML IJ SOLN: 12.5000 mg | Freq: Four times a day (QID) | INTRAMUSCULAR | Status: DC | PRN

## 2017-02-12 MED ORDER — ONDANSETRON HCL 4 MG PO TABS
4.0000 mg | ORAL_TABLET | Freq: Four times a day (QID) | ORAL | Status: DC | PRN
Start: 2017-02-12 — End: 2017-02-16

## 2017-02-12 MED ORDER — ALBUTEROL (5 MG/ML) CONTINUOUS INHALATION SOLN
10.0000 mg/h | INHALATION_SOLUTION | Freq: Once | RESPIRATORY_TRACT | Status: AC
  Administered 2017-02-12: 10 mg/h via RESPIRATORY_TRACT
  Filled 2017-02-12: qty 20

## 2017-02-12 MED ORDER — ONDANSETRON HCL 4 MG/2ML IJ SOLN: 4.0000 mg | Freq: Four times a day (QID) | INTRAMUSCULAR | Status: DC | PRN

## 2017-02-12 MED ORDER — HEPARIN (PORCINE) IN NACL 100-0.45 UNIT/ML-% IJ SOLN
850.0000 [IU]/h | INTRAMUSCULAR | Status: DC
  Administered 2017-02-12: 650 [IU]/h via INTRAVENOUS
  Administered 2017-02-14: 900 [IU]/h via INTRAVENOUS
  Filled 2017-02-12 (×3): qty 250

## 2017-02-12 MED ORDER — DIPHENHYDRAMINE HCL 50 MG/ML IJ SOLN
12.5000 mg | INTRAMUSCULAR | Status: AC
  Administered 2017-02-13: 12.5 mg via INTRAVENOUS
  Filled 2017-02-12: qty 1

## 2017-02-12 MED ORDER — NITROGLYCERIN IN D5W 200-5 MCG/ML-% IV SOLN
0.0000 ug/min | Freq: Once | INTRAVENOUS | Status: AC
  Administered 2017-02-12: 5 ug/min via INTRAVENOUS
  Filled 2017-02-12: qty 250

## 2017-02-12 MED ORDER — MAGNESIUM SULFATE 2 GM/50ML IV SOLN
2.0000 g | Freq: Once | INTRAVENOUS | Status: AC
  Administered 2017-02-12: 2 g via INTRAVENOUS
  Filled 2017-02-12: qty 50

## 2017-02-12 MED ORDER — ALBUTEROL SULFATE (2.5 MG/3ML) 0.083% IN NEBU: 2.5000 mg | INHALATION_SOLUTION | RESPIRATORY_TRACT | Status: DC | PRN

## 2017-02-12 MED ORDER — NICOTINE 21 MG/24HR TD PT24
21.0000 mg | MEDICATED_PATCH | Freq: Every day | TRANSDERMAL | Status: DC
  Administered 2017-02-13 – 2017-02-16 (×5): 21 mg via TRANSDERMAL
  Filled 2017-02-12 (×5): qty 1

## 2017-02-12 MED ORDER — ACETAMINOPHEN 650 MG RE SUPP: 650.0000 mg | Freq: Four times a day (QID) | RECTAL | Status: DC | PRN

## 2017-02-12 NOTE — ED Triage Notes (Signed)
Pt transported from home by [facility] c/o shortness of breath, L shoulder pain. Pt A & O, appears very SHOB, RT called for BIPAP.  Code stemi called d/t elevation by EMS, Dr. Tamala Julian at bedside, pt to be worked up for pulmonary issues

## 2017-02-12 NOTE — H&P (Signed)
History and Physical    JOSEEDUARDO Berry MWN:[id number] DOB: 1926-02-13 DOA: 02/12/2017  Referring MD/NP/PA: Hettie Holstein, M.D. (resident) PCP: Lillard Anes, MD  Patient coming from: Home via EMS  Chief Complaint: Respiratory distress  HPI: Craig Berry is a 83 y.o. male with medical history significant of emphysema/COPD, oxygen dependent, CAD, HTN, Reginald date, PVD, tobacco abuse; who presented with acute respiratory distress. The patient's daughter helps provide additional history as he is currently on BiPAP.   patient lives at home independently, but daughter lives next door. He reportedly still smokes one pack of cigarettes per day on average. 3 days ago the patient mowed his lawn and thereafter was noted to have significant worsening of his breathing. The following day he followed up with his primary care provider who started him on antibiotics of Levaquin and steroids. Despite using his home oxygen and inhalers patient noted worsening of his shortness of breath. Patient became significantly fatigued even getting up from his recliner. Today, patient had gone to his daughter's house to have lunch and when he arrived was noted to significant respiratory distress breaking out into a cold sweat. O2 saturations were noted to be in 70s. Associated symptoms include mild constipation, poor appetite, chronic cough that appears to be unchanged. Denies having any fever, chest pain, weight gain, leg swelling, abdominal pain, nausea, vomiting, or diarrhea.   ED Course: Upon admission into the emergency department was seen to be afebrile, respirations 21-28, blood pressure elevated up to 183/93, O2 saturations 94-100% on BiPAP. Labs revealed WBC 13, hemoglobin 12.2, platelets 434, BUN 27, creatinine 1.89, troponin 1.54, and BNP 635.3.  Patient received 2 g of magnesium sulfate, 125 mg Solu-Medrol, DuoNeb. He was given Levaquin, and placed on nitroglycerin gtt as well as heparin gtt.   Review  of Systems: As per HPI otherwise 10 point review of systems negative.   Past Medical History:  Diagnosis Date  . CAD (coronary artery disease)   . Cerebrovascular disease   . COPD (chronic obstructive pulmonary disease) ([place])   . History of abdominal aortic aneurysm repair   . HTN (hypertension)   . Hyperlipidemia   . PVD (peripheral vascular disease) ([place])   . Renal cell cancer [facility])     Past Surgical History:  Procedure Laterality Date  . ABDOMINAL AORTIC ANEURYSM REPAIR  2000  . nephrectomy       reports that he has been smoking.  He has never used smokeless tobacco. He reports that he does not drink alcohol or use drugs.  No Known Allergies  Family History  Problem Relation Age of Onset  . Heart disease Unknown   . Cancer Mother        deceased  . Heart attack Father        deceased  . Cancer Father        deceased  . Heart attack Brother 85       deceased    Prior to Admission medications   Medication Sig Start Date End Date Taking? Authorizing Provider  aspirin EC 81 MG tablet Take 81 mg by mouth at bedtime.   Yes [provider]  beta carotene w/minerals (OCUVITE) tablet Take 1 tablet by mouth daily.   Yes [provider]  clopidogrel (PLAVIX) 75 MG tablet Take 75 mg by mouth at bedtime. 11/18/16  Yes [provider]  levofloxacin (LEVAQUIN) 500 MG tablet Take 50 mg by mouth daily. 10 day course started 02/10/17 02/10/17  Yes [provider]  Multiple Vitamin (MULTIVITAMIN WITH MINERALS) TABS tablet Take 1 tablet by mouth daily.   Yes [provider]  NORVASC 5 MG tablet TAKE 1&1/2 TABLETS EVERY OTHER DAY. Patient taking differently: TAKE 1 TABLET BY MOUTH DAILY AT BEDTIME 02/14/11  Yes Hillary Bow, MD  Omega-3 Fatty Acids (FISH OIL PO) Take 1,600 mg by mouth 2 (two) times daily.   Yes [provider]  OVER THE COUNTER MEDICATION Take 1 tablet by mouth daily. Hemp gummies for appetite   Yes [provider]  PROBIOTIC CAPS Take 1 capsule by mouth daily.     Yes [provider]  RANEXA 500 MG 12 hr tablet Take 500 mg by mouth 2 (two) times daily. 01/28/17  Yes [provider]  amitriptyline (ELAVIL) 25 MG tablet Take 25 mg by mouth at bedtime.      [provider]  fish oil-omega-3 fatty acids 1000 MG capsule Take 1 g by mouth 2 (two) times daily.      [provider]  HORSE CHESTNUT PO Take 1 tablet by mouth daily.     [provider]  meloxicam (MOBIC) 15 MG tablet Take 15 mg by mouth daily. 01/16/17   [provider]  Multiple Vitamin (MULTIVITAMIN) capsule Take 1 capsule by mouth daily.      [provider]  Multiple Vitamins-Minerals (EYE VITAMINS PO) Take 1 capsule by mouth 2 (two) times daily.      [provider]  omeprazole (PRILOSEC) 20 MG capsule Take 1 tablet by mouth Daily. 05/25/12   [provider]  Red Yeast Rice 600 MG CAPS Take 1 capsule by mouth daily.      [provider]  rosuvastatin (CRESTOR) 20 MG tablet Take 20 mg by mouth at bedtime. 12/27/16   [provider]  sertraline (ZOLOFT) 25 MG tablet Take 0.5 tablets by mouth Daily. 06/05/12   [provider]  simvastatin (ZOCOR) 40 MG tablet Take 20 mg by mouth daily.     [provider]  zolpidem (AMBIEN) 5 MG tablet Take 5 mg by mouth at bedtime as needed for sleep.     [provider]    Physical Exam: Constitutional: Frail elderly male who appears now more comfortable on BiPAP in no acute distress. Vitals:   02/12/17 2053 02/12/17 2100 02/12/17 2130 02/12/17 2145  BP:  (!) 152/97 (!) 164/112 (!) 151/92  Pulse: 90 82 85 85  Resp: (!) 27 (!) 24 (!) 24 (!) 21  Temp:      TempSrc:      SpO2:  99% 100% 98%  Weight:      Height:       Eyes: PERRL, lids and conjunctivae normal ENMT: Mucous membranes are dry. Posterior pharynx clear of any exudate or lesions.   Neck: normal, supple, no  masses, no thyromegaly Respiratory: Expiratory wheezes appreciated throughout both lung fields decreased overall aeration. Patient currently on BiPAP with a regular respiratory rate. Cardiovascular: Regular rate and rhythm. Distant heart sounds. No lower extremity edema.  Abdomen: no tenderness, no masses palpated. No hepatosplenomegaly. Bowel sounds positive.  Musculoskeletal: no clubbing / cyanosis. No joint deformity upper and lower extremities. Good ROM, no contractures. Normal muscle tone.  Skin: no rashes, lesions, ulcers. No induration Neurologic: CN 2-12 grossly intact. Sensation intact, DTR normal. Strength 5/5 in all 4.  Psychiatric: Normal judgment and insight. Alert and oriented x 3. Normal mood.     Labs on Admission: I have  personally reviewed following labs and imaging studies  CBC:  Recent Labs Lab 02/12/17 2032  WBC 13.0*  NEUTROABS 10.6*  HGB 12.2*  HCT 38.3*  MCV 96.7  PLT 025*   Basic Metabolic Panel:  Recent Labs Lab 02/12/17 2032  NA 136  K 4.9  CL 101  CO2 25  GLUCOSE 143*  BUN 27*  CREATININE 1.89*  CALCIUM 9.8   GFR: Estimated Creatinine Clearance: 20.4 mL/min (A) (by C-G formula based on SCr of 1.89 mg/dL (H)). Liver Function Tests: No results for input(s): AST, ALT, ALKPHOS, BILITOT, PROT, ALBUMIN in the last 168 hours. No results for input(s): LIPASE, AMYLASE in the last 168 hours. No results for input(s): AMMONIA in the last 168 hours. Coagulation Profile: No results for input(s): INR, PROTIME in the last 168 hours. Cardiac Enzymes: No results for input(s): CKTOTAL, CKMB, CKMBINDEX, TROPONINI in the last 168 hours. BNP (last 3 results) No results for input(s): PROBNP in the last 8760 hours. HbA1C: No results for input(s): HGBA1C in the last 72 hours. CBG: No results for input(s): GLUCAP in the last 168 hours. Lipid Profile: No results for input(s): CHOL, HDL, LDLCALC, TRIG, CHOLHDL, LDLDIRECT in the last 72 hours. Thyroid Function  Tests: No results for input(s): TSH, T4TOTAL, FREET4, T3FREE, THYROIDAB in the last 72 hours. Anemia Panel: No results for input(s): VITAMINB12, FOLATE, FERRITIN, TIBC, IRON, RETICCTPCT in the last 72 hours. Urine analysis: No results found for: COLORURINE, APPEARANCEUR, LABSPEC, PHURINE, GLUCOSEU, HGBUR, BILIRUBINUR, KETONESUR, PROTEINUR, UROBILINOGEN, NITRITE, LEUKOCYTESUR Sepsis Labs: No results found for this or any previous visit (from the past 240 hour(s)).   Radiological Exams on Admission: Dg Chest Port 1 View  Result Date: 02/12/2017 CLINICAL DATA:  83 year old male with acute shortness of breath tonight. EXAM: PORTABLE CHEST 1 VIEW COMPARISON:  02/13/2012 chest CT and prior exams. FINDINGS: Mild cardiomegaly and emphysema again noted. Biapical pleural-parenchymal scarring again noted. There is no evidence of focal airspace disease, pulmonary edema, suspicious pulmonary nodule/mass, pleural effusion, or pneumothorax. No acute bony abnormalities are identified. IMPRESSION: No evidence of acute cardiopulmonary disease. Emphysema and mild cardiomegaly. Electronically Signed   By: Margarette Canada M.D.   On: 02/12/2017 21:11    EKG: Independently reviewed. Sinus rhythm  Assessment/Plan Acute hypoxic respiratory failure/COPD exacerbation: Patient was found to be hypoxic at 70% on room air prior to arrival. Daughter reports of chronic cough. Improvement of respiratory distress after being placed on BiPAP. Patient found to be significantly wheezing on physical exam. Chest x-ray showing emphysema with mild cardiomegaly. - Admit to stepdown - Continuous pulse oximetry with nasal cannula oxygen - BiPAP prn  - Levaquin IV - Duonebs  q 4hrs prn - Brovana and budesonide twice a day - Solmedrol IV 60mg  q 8hr   NSTEMI with H/O CAD: Patient's initial troponin 1.56. EKG showing no significant ST-T wave changes. - Heparin per pharmacy - Trend cardiac enzymes - Check echocardiogram  SIRS: Acute.  Patient presents with tachypnea and WBC 13. Lactic acid was not obtained initially. Question possibility of underlying infection. Patient's chest x-ray showed emphysema without acute infiltrate. - Sepsis protocol initiated w/o fluid bolus  2/2 elevated BNP - Check lactic acid and progressive diet  - Follow-up sputum cultures and studies - Check urinalysis  - Empiric antibiotics of Levaquin  Elevated BNP: Patient BNP was noted to be significant elevated at 653.3. Clinically patient does not show any significant signs of a fluid overloaded at this time.  Chronic kidney disease stage III: Baseline creatinine  previously was noted to be 1.69 and 2015. Patient presents with a creatinine of 1.89 and BUN of 27 on admission.  - Continue to monitor   Hyperlipidemia -  F/u Lipid  Tobacco abuse: Patient reports still smoking 1ppd. - nicotine patch  DVT prophylaxis: Heparin  Code Status:Full Family Communication:  Discuss plan of care with patient and family present at bedside  Disposition Plan: TBD Consults called: Cardiology  Admission status: Inpatient   Norval Morton MD [facility] Pager [phone]  If 7PM-7AM, please contact night-coverage [email or web address] Password [facility]  02/12/2017, 10:43 PM

## 2017-02-12 NOTE — ED Notes (Signed)
Cardiology, Dr. Hassell Done at bedside

## 2017-02-12 NOTE — Progress Notes (Signed)
Pharmacy Antibiotic Note  Craig Berry is a 83 y.o. male admitted on 02/12/2017 with pneumonia.  Pharmacy has been consulted for Levaquin dosing. WBC 13. Noted renal dysfunction.  Plan: Levaquin 500 mg IV q48h Trend WBC, temp F/U infectious work-up PO as able  Height: 5\' 11"  (180.3 cm) Weight: 118 lb (53.5 kg) IBW/kg (Calculated) : 75.3  Temp (24hrs), Avg:97.4 F (36.3 C), Min:97.4 F (36.3 C), Max:97.4 F (36.3 C)   Recent Labs Lab 02/12/17 2032  WBC 13.0*  CREATININE 1.89*    Estimated Creatinine Clearance: 20.4 mL/min (A) (by C-G formula based on SCr of 1.89 mg/dL (H)).    No Known Allergies  Narda Bonds 02/12/2017 11:59 PM

## 2017-02-12 NOTE — ED Provider Notes (Signed)
[facility] DEPT Provider Note   CSN: [id number] Arrival date & time: 02/12/17  2028     History   Chief Complaint Chief Complaint  Patient presents with  . Shortness of Breath    HPI Craig Berry is a 83 y.o. male.  Patient reports that he had onset of shortness of breath and left upper extremity pain earlier today. Reports it feels like his prior COPD exacerbations. EMS was called. He received bronchodilators with them with reported improvement in symptoms. Denies any fevers or chills. Denies chest pain. No lower extremity swelling.   The history is provided by the patient.  Illness  This is a recurrent problem. The current episode started 6 to 12 hours ago. The problem occurs constantly. The problem has been gradually worsening. Associated symptoms include shortness of breath. Pertinent negatives include no chest pain and no abdominal pain. Relieved by: bronchodilators. The treatment provided moderate relief.    Past Medical History:  Diagnosis Date  . CAD (coronary artery disease)   . Cerebrovascular disease   . COPD (chronic obstructive pulmonary disease) ([place])   . History of abdominal aortic aneurysm repair   . HTN (hypertension)   . Hyperlipidemia   . PVD (peripheral vascular disease) ([place])   . Renal cell cancer [facility])     Patient Active Problem List   Diagnosis Date Noted  . SYNCOPE AND COLLAPSE 11/11/2009  . RENAL CELL CANCER 04/25/2009  . HYPERLIPIDEMIA 04/25/2009  . TOBACCO ABUSE 04/25/2009  . HYPERTENSION 04/25/2009  . CAD 04/25/2009  . CEREBROVASCULAR DISEASE 04/25/2009  . PERIPHERAL VASCULAR DISEASE 04/25/2009  . COPD 04/25/2009  . ABDOMINAL AORTIC ANEURYSM REPAIR, HX OF 04/25/2009    Past Surgical History:  Procedure Laterality Date  . ABDOMINAL AORTIC ANEURYSM REPAIR  2000  . nephrectomy         Home Medications    Prior to Admission medications   Medication Sig Start Date End Date Taking? Authorizing Provider  albuterol  (PROVENTIL HFA;VENTOLIN HFA) 108 (90 Base) MCG/ACT inhaler Inhale 2 puffs into the lungs every 6 (six) hours as needed for wheezing or shortness of breath.   Yes [provider]  aspirin EC 81 MG tablet Take 81 mg by mouth at bedtime.   Yes [provider]  beta carotene w/minerals (OCUVITE) tablet Take 1 tablet by mouth daily.   Yes [provider]  budesonide-formoterol (SYMBICORT) 160-4.5 MCG/ACT inhaler Inhale 2 puffs into the lungs 2 (two) times daily.   Yes [provider]  clopidogrel (PLAVIX) 75 MG tablet Take 75 mg by mouth at bedtime. 11/18/16  Yes [provider]  Dextromethorphan-Guaifenesin (CORICIDIN HBP CONGESTION/COUGH) 10-200 MG CAPS Take 1 tablet by mouth daily as needed (cough/congestion).   Yes [provider]  fish oil-omega-3 fatty acids 1000 MG capsule Take 1 g by mouth 2 (two) times daily.     Yes [provider]  levofloxacin (LEVAQUIN) 500 MG tablet Take 500 mg by mouth daily. 10 day course started 02/10/17 02/10/17  Yes [provider]  meloxicam (MOBIC) 15 MG tablet Take 15 mg by mouth daily. 01/16/17  Yes [provider]  Multiple Vitamin (MULTIVITAMIN WITH MINERALS) TABS tablet Take 1 tablet by mouth daily.   Yes [provider]  NORVASC 5 MG tablet TAKE 1&1/2 TABLETS EVERY OTHER DAY. Patient taking differently: TAKE 1 TABLET BY MOUTH DAILY AT BEDTIME 02/14/11  Yes Hillary Bow, MD  Omega-3 Fatty Acids (FISH OIL PO) Take 1,600 mg by mouth 2 (two)  times daily.   Yes [provider]  OVER THE COUNTER MEDICATION Take 1 tablet by mouth daily. Hemp gummies for appetite   Yes [provider]  OXYGEN Inhale 2.5 L into the lungs continuous.   Yes [provider]  predniSONE (DELTASONE) 5 MG tablet Take 5-10 mg by mouth See admin instructions. Tapered course started 02/10/17: Day 1: take 2 tablets (10 mg) by mouth with breakfast, lunch and supper, Day 2: take 2  tablets (10 mg) with breakfast and lunch and 1 tablet (5 mg) with supper Day 3: take 2 tablets (10 mg) with breakfast and 1 tablet (5 mg) with lunch and supper, Day 4: take 1 tablet (5 mg) with breakfast, lunch and supper, Day 5: Take 1 tablet (5 mg) with breakfast and supper, Day 6: take 1 tablet (5 mg) with breakfast, then stop 02/10/17  Yes [provider]  PROBIOTIC CAPS Take 1 capsule by mouth daily.     Yes [provider]  ranolazine (RANEXA) 500 MG 12 hr tablet Take 500 mg by mouth 2 (two) times daily.   Yes [provider]  rosuvastatin (CRESTOR) 20 MG tablet Take 20 mg by mouth at bedtime. 12/27/16  Yes [provider]    Family History Family History  Problem Relation Age of Onset  . Heart disease Unknown   . Cancer Mother        deceased  . Heart attack Father        deceased  . Cancer Father        deceased  . Heart attack Brother 62       deceased    Social History Social History  Substance Use Topics  . Smoking status: Current Every Day Smoker  . Smokeless tobacco: Never Used  . Alcohol use No     Allergies   Patient has no known allergies.   Review of Systems Review of Systems  Constitutional: Positive for fatigue. Negative for chills and fever.  Respiratory: Positive for cough, shortness of breath and wheezing.   Cardiovascular: Negative for chest pain, palpitations and leg swelling.  Gastrointestinal: Negative for abdominal pain, diarrhea, nausea and vomiting.  Musculoskeletal: Negative for back pain.  All other systems reviewed and are negative.    Physical Exam Updated Vital Signs BP (!) 151/92   Pulse 85   Temp 97.4 F (36.3 C) (Oral)   Resp (!) 21   Ht 5\' 11"  (1.803 m)   Wt 53.5 kg   SpO2 98%   BMI 16.46 kg/m   Physical Exam  Constitutional: He appears well-developed and well-nourished. He has a sickly appearance. He appears ill. He appears distressed.  HENT:  Head: Normocephalic and atraumatic.    Eyes: Conjunctivae are normal.  Neck: Neck supple.  Cardiovascular: Normal rate and regular rhythm.   No murmur heard. Pulmonary/Chest: Effort normal and breath sounds normal. No respiratory distress.  Abdominal: Soft. There is no tenderness.  Musculoskeletal: He exhibits no edema.  Neurological: He is alert.  Skin: Skin is warm and dry.  Psychiatric: He has a normal mood and affect.  Nursing note and vitals reviewed.    ED Treatments / Results  Labs (all labs ordered are listed, but only abnormal results are displayed) Labs Reviewed  BASIC METABOLIC PANEL - Abnormal; Notable for the following:       Result Value   Glucose, Bld 143 (*)    BUN 27 (*)    Creatinine, Ser 1.89 (*)    GFR  calc non Af Amer 30 (*)    GFR calc Af Amer 35 (*)    All other components within normal limits  CBC WITH DIFFERENTIAL/PLATELET - Abnormal; Notable for the following:    WBC 13.0 (*)    RBC 3.96 (*)    Hemoglobin 12.2 (*)    HCT 38.3 (*)    Platelets 434 (*)    Neutro Abs 10.6 (*)    Monocytes Absolute 1.3 (*)    All other components within normal limits  BRAIN NATRIURETIC PEPTIDE - Abnormal; Notable for the following:    B Natriuretic Peptide 635.3 (*)    All other components within normal limits  I-STAT TROPOININ, ED - Abnormal; Notable for the following:    Troponin i, poc 1.54 (*)    All other components within normal limits  CULTURE, BLOOD (ROUTINE X 2)  CULTURE, BLOOD (ROUTINE X 2)  HEPARIN LEVEL (UNFRACTIONATED)  CBC  LIPID PANEL  I-STAT VENOUS BLOOD GAS, ED    EKG  EKG Interpretation  Date/Time:  Sunday Feb 12 2017 20:33:45 EDT Ventricular Rate:  98 PR Interval:    QRS Duration: 86 QT Interval:  340 QTC Calculation: 435 R Axis:   69 Text Interpretation:  Sinus rhythm Probable anteroseptal infarct, old st elevation v3,v4 Confirmed by RAY MD, Andee Poles [phone]) on 02/12/2017 9:05:30 PM       Radiology Dg Chest Port 1 View  Result Date: 02/12/2017 CLINICAL DATA:   83 year old male with acute shortness of breath tonight. EXAM: PORTABLE CHEST 1 VIEW COMPARISON:  02/13/2012 chest CT and prior exams. FINDINGS: Mild cardiomegaly and emphysema again noted. Biapical pleural-parenchymal scarring again noted. There is no evidence of focal airspace disease, pulmonary edema, suspicious pulmonary nodule/mass, pleural effusion, or pneumothorax. No acute bony abnormalities are identified. IMPRESSION: No evidence of acute cardiopulmonary disease. Emphysema and mild cardiomegaly. Electronically Signed   By: Margarette Canada M.D.   On: 02/12/2017 21:11    Procedures Procedures (including critical care time)  Medications Ordered in ED Medications  heparin ADULT infusion 100 units/mL (25000 units/234mL sodium chloride 0.45%) (650 Units/hr Intravenous New Bag/Given 02/12/17 2145)  levofloxacin (LEVAQUIN) IVPB 750 mg (not administered)  metoprolol tartrate (LOPRESSOR) tablet 25 mg (not administered)  atorvastatin (LIPITOR) tablet 80 mg (not administered)  albuterol (PROVENTIL,VENTOLIN) solution continuous neb (10 mg/hr Nebulization Given 02/12/17 2053)  ipratropium (ATROVENT) nebulizer solution 0.5 mg (0.5 mg Nebulization Given 02/12/17 2053)  magnesium sulfate IVPB 2 g 50 mL (0 g Intravenous Stopped 02/12/17 2211)  nitroGLYCERIN 50 mg in dextrose 5 % 250 mL (0.2 mg/mL) infusion (5 mcg/min Intravenous New Bag/Given 02/12/17 2128)  methylPREDNISolone sodium succinate (SOLU-MEDROL) 125 mg/2 mL injection 125 mg (125 mg Intravenous Given 02/12/17 2125)  heparin bolus via infusion 2,000 Units (2,000 Units Intravenous Bolus from Bag 02/12/17 2145)     Initial Impression / Assessment and Plan / ED Course  I have reviewed the triage vital signs and the nursing notes.  Pertinent labs & imaging results that were available during my care of the patient were reviewed by me and considered in my medical decision making (see chart for details).     On arrival here, patient continues to be in  respiratory distress. She does report some improvement in symptoms after bronchodilators with EMS. Diffuse diminished breath sounds with mild wheezes. Started the patient on BiPAP. Gave him more bronchodilators, IV steroids, IV magnesium. Blood gas shows a pH of 7.29 with a PCO2 of 52. Concern for a COPD exacerbation. Started on  antibiotics prophylactically. Chest x-ray without evidence of pneumothorax or pneumonia. EKG shows some mild elevations in V3 and V4, no significant change from the prior EKG. Troponin was elevated at 1.5. Started the patient on a heparin drip and a nitroglycerin infusion. Spoke with cardiology and have them evaluate the patient at bedside. They do not have the patient requires emergent catheterization, but will likely catheterize the patient in the morning. BNP also mildly elevated at 600; no prior to compare to. Clinically the patient is not in fluid overload. We'll hold off on diuresis for now. Will admit the patient to the hospitalist stepdown unit.  Final Clinical Impressions(s) / ED Diagnoses   Final diagnoses:  COPD exacerbation ([place])  NSTEMI (non-ST elevated myocardial infarction) [facility])    New Prescriptions New Prescriptions   No medications on file     Maryan Puls, MD 02/12/17 [id number]    Pattricia Boss, MD 02/18/17 1134

## 2017-02-12 NOTE — Progress Notes (Signed)
Patient is on NIV tolerating it well. CAT started

## 2017-02-12 NOTE — ED Notes (Signed)
RT at bedside for BIPAP placement 

## 2017-02-12 NOTE — Consult Note (Signed)
Cardiology Consultation   Patient ID: Craig Berry; [id number]; 1926/09/25   Admit date: 02/12/2017 Date of Consult: 02/12/2017  Referring Provider:  NA      Primary Care Provider: Lillard Anes, MD Cardiologist: [facility] Electrophysiologist:    Reason for Consultation: elevated troponin  History of Present Illness: Craig Berry is a 83 y.o. male who is being seen today for the evaluation of troponin elevation at the request of the [facility] ED. He was originally billed as a code STEMI, but after review of the 12-lead and clinical information the STEMI was actually cancelled.  Patient says that he has a long h/o COPD and is on home O2, and he mowed the grass about 3 days ago without a mask on, and since then has been very SOB. He says even walking from room to room in his house makes him SOB to the point he will have to stop and catch his breath. Escalating his supplemental O2 has not been helpful for him. He did get what sounds like maybe steroids from PCP on Friday which helped initially, but then his breathing worsened again.  He does have h/o CAD but I am unable to find any clear details about the nature of his CAD. He tells me he does not think he has ever had cardiac stents although he remembers having LHC in the past. he says he really has not been having any associated chest tightness or pressure with exertion. He notes he has had some pain in his shoulders over the past few days.  Family members note that today his breathing seemed worse than it had been over the past 1-2 days. He appeared diaphoretic and uncomfortable so EMS was called.  At the time of my interview with him, he appears comfortable on Bipap and says he is not having any pain. He actually says he would like to go home tonight.  Past Medical History:  Diagnosis Date  . CAD (coronary artery disease)   . Cerebrovascular disease   . COPD (chronic obstructive pulmonary disease) ([place])   . History of abdominal  aortic aneurysm repair   . HTN (hypertension)   . Hyperlipidemia   . PVD (peripheral vascular disease) ([place])   . Renal cell cancer [facility])     Past Surgical History:  Procedure Laterality Date  . ABDOMINAL AORTIC ANEURYSM REPAIR  2000  . nephrectomy        Current Medications:   amitriptyline (ELAVIL) 25 MG tablet Take 25 mg by mouth at bedtime.      [provider]  aspirin 81 MG tablet Take 81 mg by mouth 2 (two) times daily.      [provider]  fish oil-omega-3 fatty acids 1000 MG capsule Take 1 g by mouth 2 (two) times daily.      [provider]  HORSE CHESTNUT PO Take 1 tablet by mouth daily.     [provider]  Multiple Vitamin (MULTIVITAMIN) capsule Take 1 capsule by mouth daily.      [provider]  Multiple Vitamins-Minerals (EYE VITAMINS PO) Take 1 capsule by mouth 2 (two) times daily.      [provider]  NORVASC 5 MG tablet TAKE 1&1/2 TABLETS EVERY OTHER DAY. 02/14/11   Hillary Bow, MD  omeprazole (PRILOSEC) 20 MG capsule Take 1 tablet by mouth Daily. 05/25/12   [provider]  PROBIOTIC CAPS Take 1 capsule by mouth daily.      [provider]  Red Yeast Rice 600 MG CAPS Take 1 capsule by mouth daily.      [provider]  sertraline (ZOLOFT) 25 MG tablet Take 0.5 tablets by mouth Daily. 06/05/12   [provider]  simvastatin (ZOCOR) 40 MG tablet Take 20 mg by mouth daily.     [provider]  zolpidem (AMBIEN) 5 MG tablet Take 5 mg by mouth at bedtime as needed for sleep.     [provider]      Infused Medications: . heparin 650 Units/hr (02/12/17 2145)  . levofloxacin      PRN Medications:    Allergies:   No Known Allergies  Social History:   The patient  reports that he has been smoking.  He has never used smokeless tobacco. He reports that he does not drink alcohol or use drugs.    Family History:   The  patient's family history includes Cancer in his father and mother; Heart attack in his father; Heart attack (age of onset: 28) in his brother.   ROS:  Please see the history of present illness.  All other ROS reviewed and negative.     Vital Signs: Blood pressure (!) 151/92, pulse 85, temperature 97.4 F (36.3 C), temperature source Oral, resp. rate (!) 21, height 5\' 11"  (1.803 m), weight 53.5 kg (118 lb), SpO2 98 %.   PHYSICAL EXAM: General:  Well nourished, well developed, in mild resp distress on BiPAP w/ accessory muscle use HEENT: normal Lymph: no adenopathy Neck: no JVD Endocrine:  No thryomegaly Vascular: No carotid bruits; DP pulses 2+ bilaterally Cardiac:  normal S1, S2; RRR; no murmur although HS obscured by bipap Lungs:  Decreased air movement w/ labored breathing; +accessory muscle use. Diffuse wheezing insp/exp bilaterally Abd: soft, nontender, no hepatomegaly  Ext: no edema Musculoskeletal:  No deformities, BUE and BLE strength normal and equal Skin: warm and dry  Neuro:  CNs 2-12 intact, no focal abnormalities noted Psych:  Normal affect   EKG:  02-12-17 NSR with probable old anteroseptal infarct; no definite acute ST changes  Labs: No results for input(s): CKTOTAL, CKMB, TROPONINI in the last 72 hours.  Recent Labs  02/12/17 2041  TROPIPOC 1.54*    Lab Results  Component Value Date   WBC 13.0 (H) 02/12/2017   HGB 12.2 (L) 02/12/2017   HCT 38.3 (L) 02/12/2017   MCV 96.7 02/12/2017   PLT 434 (H) 02/12/2017    Recent Labs Lab 02/12/17 2032  NA 136  K 4.9  CL 101  CO2 25  BUN 27*  CREATININE 1.89*  CALCIUM 9.8  GLUCOSE 143*   No results found for: CHOL, HDL, LDLCALC, TRIG No results found for: DDIMER  Radiology/Studies:  Dg Chest Port 1 View  Result Date: 02/12/2017 CLINICAL DATA:  83 year old male with acute shortness of breath tonight. EXAM: PORTABLE CHEST 1 VIEW COMPARISON:  02/13/2012 chest CT and prior exams. FINDINGS: Mild cardiomegaly  and emphysema again noted. Biapical pleural-parenchymal scarring again noted. There is no evidence of focal airspace disease, pulmonary edema, suspicious pulmonary nodule/mass, pleural effusion, or pneumothorax. No acute bony abnormalities are identified. IMPRESSION: No evidence of acute cardiopulmonary disease. Emphysema and mild cardiomegaly. Electronically Signed   By: Margarette Canada M.D.   On: 02/12/2017 21:11    ASSESSMENT AND PLAN:  1. CAD w/ NSTEMI: probably type II NSTEMI due to demand from severe COPD exacerbation and HTN. In addition, there is underlying renal insufficiency which may be contributing. We know there is some  degree of underlying CAD from past notes, and there is likely some degree of HD significant CAD given magnitude of trop elevation. Pt is currently asymptomatic from a cardiac standpoint at this time. Agree w/ heparin IV gtt and recommend keep NPO after MN for possible cath in AM. Will get TTE as well.  2. COPD exacerbation: management as per primary team  3. PAD: pt gives h/o PAD as well, details not known. Aggressive secondary prevention measures.   4. Dyslipidemia: no recent FLP on record; it appears he may be taking simvastatin although the meds need to be verified. May need higher-intensity statin. Will check FLP and go ahead w/ atorva 80 in place of the simva  5. HTN: BP running high--needs better BP control. It appears he is only on norvasc. Would add BB to start; other meds can be titrated/added based on BP and echo results.  Thank you for the opportunity to participate in the care of this very pleasant patient. Will follow. Please call w/ questions.   Rudean Curt, MD , [facility] 10:26 PM

## 2017-02-12 NOTE — Progress Notes (Signed)
ANTICOAGULATION CONSULT NOTE - Initial Consult  Pharmacy Consult for heparin Indication: chest pain/ACS  No Known Allergies  Patient Measurements: Height: 5\' 11"  (180.3 cm) Weight: 118 lb (53.5 kg) IBW/kg (Calculated) : 75.3 Heparin Dosing Weight: 53.5kg  Vital Signs: Temp: 97.4 F (36.3 C) (05/13 2039) Temp Source: Oral (05/13 2039) BP: 160/85 (05/13 2039) Pulse Rate: 90 (05/13 2053)  Labs: No results for input(s): HGB, HCT, PLT, APTT, LABPROT, INR, HEPARINUNFRC, HEPRLOWMOCWT, CREATININE, CKTOTAL, CKMB, TROPONINI in the last 72 hours.  CrCl cannot be calculated (No order found.).   Assessment: 89 YOM to start heparin for ACS. Patient is not on anticoagulation PTA. CBC and BMET are in process.  Goal of Therapy:  Heparin level 0.3-0.7 units/ml Monitor platelets by anticoagulation protocol: Yes   Plan: -heparin bolus with 2000 units IV x1, then start infusion at 650 units/hr -daily heparin level and CBC -follow for cardiology plans   Breken Nazari D. Marypat Kimmet, PharmD, BCPS Clinical Pharmacist Pager: [phone] 02/12/2017 9:04 PM

## 2017-02-13 ENCOUNTER — Encounter (HOSPITAL_COMMUNITY): Payer: Self-pay | Admitting: Cardiology

## 2017-02-13 ENCOUNTER — Inpatient Hospital Stay (HOSPITAL_COMMUNITY): Payer: Medicare Other

## 2017-02-13 DIAGNOSIS — I360 Nonrheumatic tricuspid (valve) stenosis: Secondary | ICD-10-CM

## 2017-02-13 DIAGNOSIS — J9600 Acute respiratory failure, unspecified whether with hypoxia or hypercapnia: Secondary | ICD-10-CM

## 2017-02-13 DIAGNOSIS — I214 Non-ST elevation (NSTEMI) myocardial infarction: Secondary | ICD-10-CM | POA: Diagnosis present

## 2017-02-13 DIAGNOSIS — J441 Chronic obstructive pulmonary disease with (acute) exacerbation: Secondary | ICD-10-CM

## 2017-02-13 DIAGNOSIS — N183 Chronic kidney disease, stage 3 unspecified: Secondary | ICD-10-CM | POA: Diagnosis present

## 2017-02-13 DIAGNOSIS — E784 Other hyperlipidemia: Secondary | ICD-10-CM

## 2017-02-13 DIAGNOSIS — I25110 Atherosclerotic heart disease of native coronary artery with unstable angina pectoris: Secondary | ICD-10-CM

## 2017-02-13 LAB — LACTIC ACID, PLASMA: LACTIC ACID, VENOUS: 1.7 mmol/L (ref 0.5–1.9)

## 2017-02-13 LAB — URINALYSIS, ROUTINE W REFLEX MICROSCOPIC
Bilirubin Urine: NEGATIVE
Glucose, UA: NEGATIVE mg/dL
Ketones, ur: NEGATIVE mg/dL
Leukocytes, UA: NEGATIVE
Nitrite: NEGATIVE
PH: 6 (ref 5.0–8.0)
Protein, ur: 30 mg/dL — AB
SPECIFIC GRAVITY, URINE: 1.01 (ref 1.005–1.030)
SQUAMOUS EPITHELIAL / LPF: NONE SEEN

## 2017-02-13 LAB — BASIC METABOLIC PANEL
ANION GAP: 9 (ref 5–15)
BUN: 27 mg/dL — ABNORMAL HIGH (ref 6–20)
CALCIUM: 9.4 mg/dL (ref 8.9–10.3)
CO2: 25 mmol/L (ref 22–32)
Chloride: 102 mmol/L (ref 101–111)
Creatinine, Ser: 1.55 mg/dL — ABNORMAL HIGH (ref 0.61–1.24)
GFR, EST AFRICAN AMERICAN: 44 mL/min — AB (ref >60)
GFR, EST NON AFRICAN AMERICAN: 38 mL/min — AB (ref >60)
Glucose, Bld: 140 mg/dL — ABNORMAL HIGH (ref 65–99)
Potassium: 4.8 mmol/L (ref 3.5–5.1)
Sodium: 136 mmol/L (ref 135–145)

## 2017-02-13 LAB — ECHOCARDIOGRAM COMPLETE
CHL CUP RV SYS PRESS: 4 mmHg
E decel time: 155 msec
E/e' ratio: 15.89
FS: 29 % (ref 28–44)
Height: 71 in
IVS/LV PW RATIO, ED: 0.92
LA ID, A-P, ES: 28 mm
LA diam end sys: 28 mm
LA diam index: 1.73 cm/m2
LA vol A4C: 56.9 ml
LV E/e'average: 15.89
LV PW d: 11 mm — AB (ref 0.6–1.1)
LV TDI E'LATERAL: 4.67
LV TDI E'MEDIAL: 4.95
LVEEMED: 15.89
LVELAT: 4.67 cm/s
LVOT VTI: 22.2 cm
LVOT area: 3.14 cm2
LVOT diameter: 20 mm
LVOT peak vel: 94.6 cm/s
LVOTSV: 70 mL
MV Dec: 155
MV pk A vel: 103 m/s
MVPG: 2 mmHg
MVPKEVEL: 74.2 m/s
RV LATERAL S' VELOCITY: 14.1 cm/s
RV TAPSE: 20.7 mm
Reg peak vel: 38.2 cm/s
TRMAXVEL: 38.2 cm/s
Weight: 1891.2 oz

## 2017-02-13 LAB — CBC
HEMATOCRIT: 35.7 % — AB (ref 39.0–52.0)
Hemoglobin: 11.4 g/dL — ABNORMAL LOW (ref 13.0–17.0)
MCH: 30.9 pg (ref 26.0–34.0)
MCHC: 31.9 g/dL (ref 30.0–36.0)
MCV: 96.7 fL (ref 78.0–100.0)
Platelets: 405 10*3/uL — ABNORMAL HIGH (ref 150–400)
RBC: 3.69 MIL/uL — ABNORMAL LOW (ref 4.22–5.81)
RDW: 14.3 % (ref 11.5–15.5)
WBC: 8 10*3/uL (ref 4.0–10.5)

## 2017-02-13 LAB — LIPID PANEL
CHOL/HDL RATIO: 2.5 ratio
Cholesterol: 152 mg/dL (ref 0–200)
HDL: 61 mg/dL (ref >40)
LDL Cholesterol: 77 mg/dL (ref 0–99)
TRIGLYCERIDES: 71 mg/dL (ref <150)
VLDL: 14 mg/dL (ref 0–40)

## 2017-02-13 LAB — HEPARIN LEVEL (UNFRACTIONATED)
Heparin Unfractionated: 0.24 IU/mL — ABNORMAL LOW (ref 0.30–0.70)
Heparin Unfractionated: 0.28 IU/mL — ABNORMAL LOW (ref 0.30–0.70)

## 2017-02-13 LAB — TROPONIN I
TROPONIN I: 1.07 ng/mL — AB (ref <0.03)
TROPONIN I: 1.56 ng/mL — AB (ref <0.03)
TROPONIN I: 1.99 ng/mL — AB (ref <0.03)

## 2017-02-13 LAB — MRSA PCR SCREENING: MRSA by PCR: NEGATIVE

## 2017-02-13 LAB — STREP PNEUMONIAE URINARY ANTIGEN: Strep Pneumo Urinary Antigen: NEGATIVE

## 2017-02-13 LAB — PROCALCITONIN

## 2017-02-13 MED ORDER — ASPIRIN 81 MG PO CHEW
81.0000 mg | CHEWABLE_TABLET | ORAL | Status: AC
  Administered 2017-02-14: 81 mg via ORAL
  Filled 2017-02-13: qty 1

## 2017-02-13 MED ORDER — ORAL CARE MOUTH RINSE
15.0000 mL | Freq: Two times a day (BID) | OROMUCOSAL | Status: DC
  Administered 2017-02-14 – 2017-02-16 (×3): 15 mL via OROMUCOSAL

## 2017-02-13 MED ORDER — NITROGLYCERIN IN D5W 200-5 MCG/ML-% IV SOLN
0.0000 ug/min | INTRAVENOUS | Status: DC
Start: 2017-02-13 — End: 2017-02-14

## 2017-02-13 MED ORDER — SODIUM CHLORIDE 0.9 % IV SOLN
INTRAVENOUS | Status: DC
  Administered 2017-02-14: 06:00:00 via INTRAVENOUS

## 2017-02-13 MED ORDER — LEVOFLOXACIN IN D5W 500 MG/100ML IV SOLN
500.0000 mg | INTRAVENOUS | Status: DC
  Administered 2017-02-14: 500 mg via INTRAVENOUS
  Filled 2017-02-13: qty 100

## 2017-02-13 MED ORDER — ATORVASTATIN CALCIUM 80 MG PO TABS
80.0000 mg | ORAL_TABLET | Freq: Every day | ORAL | Status: DC
  Administered 2017-02-13 – 2017-02-15 (×3): 80 mg via ORAL
  Filled 2017-02-13 (×3): qty 1

## 2017-02-13 MED ORDER — PERFLUTREN LIPID MICROSPHERE
1.0000 mL | INTRAVENOUS | Status: AC | PRN
  Administered 2017-02-13: 2 mL via INTRAVENOUS
  Filled 2017-02-13: qty 10

## 2017-02-13 NOTE — Progress Notes (Signed)
Patient received from ED on 4L nasal cannula, on RT's arrival, patient was short of breath and using accessory muscles, breathing treatment given and patient placed back on BIPAP. RT will continue to monitor.

## 2017-02-13 NOTE — Progress Notes (Signed)
ANTICOAGULATION CONSULT NOTE - Follow Up Consult  Pharmacy Consult for Heparin  Indication: chest pain/ACS  No Known Allergies  Patient Measurements: Height: 5\' 11"  (180.3 cm) Weight: 118 lb 3.2 oz (53.6 kg) IBW/kg (Calculated) : 75.3  Vital Signs: Temp: 98 F (36.7 C) (05/14 0459) BP: 148/83 (05/14 1531) Pulse Rate: 75 (05/14 1531)  Labs:  Recent Labs  02/12/17 2032 02/13/17 0053 02/13/17 0600 02/13/17 1543  HGB 12.2*  --  11.4*  --   HCT 38.3*  --  35.7*  --   PLT 434*  --  405*  --   HEPARINUNFRC  --   --  0.24* 0.28*  CREATININE 1.89*  --  1.55*  --   TROPONINI  --  1.99* 1.56*  --     Estimated Creatinine Clearance: 25 mL/min (A) (by C-G formula based on SCr of 1.55 mg/dL (H)).  Assessment: Heparin for NSTEMI Heparin level this PM = 0.28  Goal of Therapy:  Heparin level 0.3-0.7 units/ml Monitor platelets by anticoagulation protocol: Yes   Plan:  Heparin to 900 units / hr Follow up AM labs  Thank you Anette Guarneri, PharmD [phone]  02/13/2017,4:29 PM

## 2017-02-13 NOTE — Progress Notes (Signed)
Progress Note  Patient Name: Craig Berry Date of Encounter: 02/13/2017  Primary Cardiologist: Bettina Gavia  Subjective   Patient seen and examined this morning.  No acute complaints.  He is off BiPap and now on nasal cannula.  Denies chest pain, orhopnea, PND.  Reports breathing is much improved.  Inpatient Medications    Scheduled Meds: . arformoterol  15 mcg Nebulization BID  . atorvastatin  80 mg Oral q1800  . budesonide (PULMICORT) nebulizer solution  0.5 mg Nebulization BID  . clopidogrel  75 mg Oral QHS  . methylPREDNISolone (SOLU-MEDROL) injection  60 mg Intravenous Q8H  . metoprolol tartrate  25 mg Oral BID  . nicotine  21 mg Transdermal Daily   Continuous Infusions: . heparin 750 Units/hr (02/13/17 0733)  . [START ON 02/14/2017] levofloxacin (LEVAQUIN) IV     PRN Meds: acetaminophen **OR** acetaminophen, albuterol, diphenhydrAMINE, ipratropium, ondansetron **OR** ondansetron (ZOFRAN) IV   Vital Signs    Vitals:   02/13/17 0500 02/13/17 0530 02/13/17 0630 02/13/17 0733  BP: 124/90 135/80 (!) 141/87   Pulse: 70 69 71   Resp:  17 (!) 21   Temp:      TempSrc:      SpO2: 99% 99% 99% 97%  Weight:      Height:        Intake/Output Summary (Last 24 hours) at 02/13/17 0927 Last data filed at 02/13/17 0502  Gross per 24 hour  Intake                0 ml  Output                0 ml  Net                0 ml   Filed Weights   02/12/17 2040 02/13/17 0032  Weight: 118 lb (53.5 kg) 118 lb 3.2 oz (53.6 kg)    Telemetry    Sinus rhythm  ECG    5/13 - normal sinus with probably old anteroseptal infarct  Physical Exam   GEN: No acute distress, chronically ill appearing, on nasal cannula 2 liters ; thin, frail appearing Neck: No JVD, no bruit Cardiac: distant heart sounds, RRR, no murmurs Respiratory: diminished, mild expiratory wheeze and diffusely coarse.  GI: Soft, nontender, non-distended  MS: No edema; No deformity. Neuro:  Nonfocal  Psych: Normal  affect   Labs    Chemistry Recent Labs Lab 02/12/17 2032 02/13/17 0600  NA 136 136  K 4.9 4.8  CL 101 102  CO2 25 25  GLUCOSE 143* 140*  BUN 27* 27*  CREATININE 1.89* 1.55*  CALCIUM 9.8 9.4  GFRNONAA 30* 38*  GFRAA 35* 44*  ANIONGAP 10 9     Hematology Recent Labs Lab 02/12/17 2032 02/13/17 0600  WBC 13.0* 8.0  RBC 3.96* 3.69*  HGB 12.2* 11.4*  HCT 38.3* 35.7*  MCV 96.7 96.7  MCH 30.8 30.9  MCHC 31.9 31.9  RDW 14.3 14.3  PLT 434* 405*    Cardiac Enzymes Recent Labs Lab 02/13/17 0053 02/13/17 0600  TROPONINI 1.99* 1.56*    Recent Labs Lab 02/12/17 2041  TROPIPOC 1.54*     BNP Recent Labs Lab 02/12/17 2035  BNP 635.3*     DDimer No results for input(s): DDIMER in the last 168 hours.   Radiology    Dg Chest Port 1 View  Result Date: 02/12/2017 CLINICAL DATA:  83 year old male with acute shortness of breath tonight. EXAM: PORTABLE CHEST 1  VIEW COMPARISON:  02/13/2012 chest CT and prior exams. FINDINGS: Mild cardiomegaly and emphysema again noted. Biapical pleural-parenchymal scarring again noted. There is no evidence of focal airspace disease, pulmonary edema, suspicious pulmonary nodule/mass, pleural effusion, or pneumothorax. No acute bony abnormalities are identified. IMPRESSION: No evidence of acute cardiopulmonary disease. Emphysema and mild cardiomegaly. Electronically Signed   By: Margarette Canada M.D.   On: 02/12/2017 21:11    Cardiac Studies   Echo  -Images personally reviewed at bedside by M.D. I agree with these findings. - Left ventricle: The cavity size was normal. Wall thickness was   normal. Systolic function was moderately reduced. The estimated   ejection fraction was in the range of 35% to 40%. There is   akinesis of the mid-apical anteroseptal, lateral, inferoseptal,   and apical myocardium. There is normal contraction of basal   myocardium. Doppler parameters are consistent with abnormal left   ventricular relaxation (grade 1  diastolic dysfunction). Acoustic   contrast opacification revealed no evidence ofthrombus. - Mitral valve: Calcified annulus. There was mild regurgitation. - Right atrium: The atrium was mildly dilated. - Tricuspid valve: There was mild regurgitation.  Patient Profile     83 y.o. male with CAD, severe COPD, HTN, ongoing tobacco abuse here with acute hypoxic respiratory failure and elevated troponin along with what appears to be ischemic cardiomyopathy with EF of 35-40% with anterior hypo-/akinesis.  Assessment & Plan    # CAD # NSTEMI, suspect type 2 - patient with several risk factors for cardiac disease and some degree (although severity unclear) of CAD here with elevated troponin (1.99 >> 1.56).  BNP also elevated > 600.  However, does not appear to be volume overloaded.  Denies any chest pressure or pain throughout course of his illness.  Suspect may be ischemia due to supply-demand mismatch from severe COPD exacerbation and elevated blood pressures .  He is on heparin gtt, NTG gtt, plavix, statin, and now beta blocker.  At home, on asa and plavix plus statin.  Echo results as above.  Will plan for invasive cardiac catheterization tomorrow to rule out any obstructive CAD lesions to act upon.  HFrEF - acute combined systolic and diastolic heart failure -unknown of chronic - Echo results as above with BNP over 600.  No signs of significant volume overload on exam.  # Acute hypoxic respiratory failure secondary to COPD exacerbation - management per primary service, currently off BiPap  # Hyperlipidemia - on statin at home.  Converted to  atorvastatin here.  Lipid panel with TC 152, HDL 61, LDL 77  # HTN - SBP initially running as high as 180s.  Currently better in the 140s.  Appears just on amlodipine at home.  On NTG gtt this morning and started on metoprolol as well.   Signed, Jule Ser, DO  02/13/2017, 9:27 AM     I have seen, examined and evaluated the patient this Afternoon  along with Dr. Juleen China.  After reviewing all the available data and chart, we discussed the patients laboratory, study & physical findings as well as symptoms in detail. I agree with his findings, examination as well as impression recommendations as per our discussion.    My findings noted in Italics  Overall really hard to figure out what course of events occurred. He has a documented history of "coronary disease, however I cannot find any cath reports. He himself does not recall ever having intervention done.  He now presents with acute hypoxic respiratory failure now associated  with a troponin elevation and abnormal echocardiogram suggestive of anterior/LAD disease. This could either be demand ischemia/infarction in the setting of hypoxemia and existing disease versus mild non-STEMI causing exacerbation of COPD resulting in acute combined systolic and diastolic heart failure.  At this point, I think really the main question is is there is are not coronary disease. Had a long frank discussion with the patient and his daughter. Although he is very weak and debilitated by his chronic COPD requiring oxygen at home, he still goes out and does things like mowing the lawn and is trying to be active. As a result, we decided that based on the echocardiogram findings suggesting LAD reasonable decision to proceed with cardiac catheterization and possible PCI.  He will continue on IV heparin tonight. Nothing by mouth after midnight for planned catheterization possible PCI tomorrow.  Performing MD:  [facility] (Dr. Tamala Julian).  Procedure:  Left Heart Catheterization with Native Coronary Angiography and Possible Percutaneous Coronary Intervention  The procedure with Risks/Benefits/Alternatives and Indications was reviewed with the patient & daughter.  All questions were answered.    Risks / Complications include, but not limited to: Death, MI, CVA/TIA, VF/VT (with defibrillation), Bradycardia (need for temporary pacer  placement), contrast induced nephropathy, bleeding / bruising / hematoma / pseudoaneurysm, vascular or coronary injury (with possible emergent CT or Vascular Surgery), adverse medication reactions, infection.  Additional risks involving the use of radiation with the possibility of radiation burns and cancer were explained in detail.  The patient (and family) voice understanding and agree to proceed.  \   Glenetta Hew, M.D., M.S. Interventional Cardiologist   Pager # [phone] Phone # [phone] [address] [place], [place] [address]

## 2017-02-13 NOTE — Progress Notes (Signed)
  Echocardiogram 2D Echocardiogram has been performed.  Craig Berry L Androw 02/13/2017, 11:45 AM

## 2017-02-13 NOTE — Progress Notes (Signed)
Patient taken off of bipap and placed on 5L nasal cannula.  Sats currently 97%.  Tolerating well.  Will continue to monitor.

## 2017-02-13 NOTE — Progress Notes (Signed)
Pt resting quietly on Bipap/no distress noted/sats 98-100%.  Pt has IV heparin and Ntg infusing-pt remains chest pain free/no complaints voiced at this time. Will continue to monitor. Jessie Foot, RN

## 2017-02-13 NOTE — Progress Notes (Signed)
[facility] Progress Note  Patient: Craig Berry PPJ:[id number]   PCP: Lillard Anes, MD DOB: 06/28/1926   DOA: 02/12/2017   DOS: 02/13/2017   Date of Service: the patient was seen and examined on 02/13/2017  Subjective: Currently does not have any chest pain, continues to have shortness of breath as well as cough. No fever but had some chills at home. No nausea no vomiting. Continues to smoke. Has 10 pounds over a close over last 1 year.  Brief hospital course: Pt. with PMH of active smoker, COPD, chronic respiratory failure on oxygen, CAD, HTN, PVD; admitted on 02/12/2017, presented with complaint of shortness of breath, was found to have acute hypoxic respiratory failure likely secondary to acute on chronic CHF as well as COPD exacerbation and non-STEMI. Currently further plan is cardiac catheterization tomorrow continue to treat COPD.  Assessment and Plan: 1. SIRS. Acute on chronic hypoxic respiratory failure. COPD exacerbation with bronchitis. Patient was given IV fluid Follow up on sputum culture as well as blood culture. Patient was started on IV Levaquin which I will continue. Also started on IV Solu-Medrol 60 mg every 8 hours which I will continue. Continue duo nebs. Changing BiPAP only as needed, maintain oxygenation was 88 and 92%. Continue Mucinex. Continue to monitor in the step down unit at present.  2. Acute systolic CHF. Non-STEMI. BNP is significantly elevated, although the patient is not clinically volume overloaded at present. EF 35% with significant wall motion abnormalities. Troponin trending downwards. Currently on IV heparin. Nitroglycerin infusion as well as Plavix and statin. Beta blockers added. Cardiac catheterization scheduled tomorrow. We'll continue to closely monitor at present.  3. Active smoker. History of COPD. Weight loss. Patient will require outpatient workup, likely related screening for lung cancer. As extensive history of  smoking.  4. CKD III. Renal function stable, will be to monitor while patient is going to receive IV contrast for his cardiac cath.  Diet: Cardiac diet, nothing by mouth after midnight DVT Prophylaxis: on therapeutic anticoagulation.  Advance goals of care discussion: full code  Family Communication: family was present at bedside, at the time of interview. The pt provided permission to discuss medical plan with the family. Opportunity was given to ask question and all questions were answered satisfactorily.   Disposition:  Discharge to home.  Consultants: cardiology Procedures: Echocardiogram   Antibiotics: Anti-infectives    Start     Dose/Rate Route Frequency Ordered Stop   02/14/17 2200  levofloxacin (LEVAQUIN) IVPB 500 mg     500 mg 100 mL/hr over 60 Minutes Intravenous Every 48 hours 02/13/17 0006     02/12/17 2145  levofloxacin (LEVAQUIN) IVPB 750 mg  Status:  Discontinued     750 mg 100 mL/hr over 90 Minutes Intravenous Every 24 hours 02/12/17 2137 02/13/17 0006       Objective: Physical Exam: Vitals:   02/13/17 0730 02/13/17 0733 02/13/17 0930 02/13/17 1330  BP: (!) 146/86  (!) 154/95 (!) 155/82  Pulse: 74  70 78  Resp: (!) 22  (!) 27 (!) 24  Temp:      TempSrc:      SpO2: 100% 97% 94% 90%  Weight:      Height:        Intake/Output Summary (Last 24 hours) at 02/13/17 1544 Last data filed at 02/13/17 1000  Gross per 24 hour  Intake           100.81 ml  Output  550 ml  Net          -449.19 ml   Filed Weights   02/12/17 2040 02/13/17 0032  Weight: 53.5 kg (118 lb) 53.6 kg (118 lb 3.2 oz)   General: Alert, Awake and Oriented to Time, Place and Person. Appear in moderate distress, affect appropriate Eyes: PERRL, Conjunctiva normal ENT: Oral Mucosa clear moist. Neck: no JVD, no Abnormal Mass Or lumps Cardiovascular: S1 and S2 Present, aortic systolic Murmur, Respiratory: Bilateral Air entry equal and Decreased, no use of accessory muscle,  bilateral basal Crackles, expiratory wheezes Abdomen: Bowel Sound present, Soft and no tenderness Skin: no redness, no Rash, no induration Extremities: no Pedal edema, no calf tenderness Neurologic: Grossly no focal neuro deficit. Bilaterally Equal motor strength  Data Reviewed: CBC:  Recent Labs Lab 02/12/17 2032 02/13/17 0600  WBC 13.0* 8.0  NEUTROABS 10.6*  --   HGB 12.2* 11.4*  HCT 38.3* 35.7*  MCV 96.7 96.7  PLT 434* 161*   Basic Metabolic Panel:  Recent Labs Lab 02/12/17 2032 02/13/17 0600  NA 136 136  K 4.9 4.8  CL 101 102  CO2 25 25  GLUCOSE 143* 140*  BUN 27* 27*  CREATININE 1.89* 1.55*  CALCIUM 9.8 9.4    Liver Function Tests: No results for input(s): AST, ALT, ALKPHOS, BILITOT, PROT, ALBUMIN in the last 168 hours. No results for input(s): LIPASE, AMYLASE in the last 168 hours. No results for input(s): AMMONIA in the last 168 hours. Coagulation Profile: No results for input(s): INR, PROTIME in the last 168 hours. Cardiac Enzymes:  Recent Labs Lab 02/13/17 0053 02/13/17 0600  TROPONINI 1.99* 1.56*   BNP (last 3 results) No results for input(s): PROBNP in the last 8760 hours. CBG: No results for input(s): GLUCAP in the last 168 hours. Studies: Dg Chest Port 1 View  Result Date: 02/12/2017 CLINICAL DATA:  83 year old male with acute shortness of breath tonight. EXAM: PORTABLE CHEST 1 VIEW COMPARISON:  02/13/2012 chest CT and prior exams. FINDINGS: Mild cardiomegaly and emphysema again noted. Biapical pleural-parenchymal scarring again noted. There is no evidence of focal airspace disease, pulmonary edema, suspicious pulmonary nodule/mass, pleural effusion, or pneumothorax. No acute bony abnormalities are identified. IMPRESSION: No evidence of acute cardiopulmonary disease. Emphysema and mild cardiomegaly. Electronically Signed   By: Margarette Canada M.D.   On: 02/12/2017 21:11    Scheduled Meds: . arformoterol  15 mcg Nebulization BID  . [START ON  02/14/2017] aspirin  81 mg Oral Pre-Cath  . atorvastatin  80 mg Oral QHS  . budesonide (PULMICORT) nebulizer solution  0.5 mg Nebulization BID  . clopidogrel  75 mg Oral QHS  . methylPREDNISolone (SOLU-MEDROL) injection  60 mg Intravenous Q8H  . metoprolol tartrate  25 mg Oral BID  . nicotine  21 mg Transdermal Daily   Continuous Infusions: . [START ON 02/14/2017] sodium chloride    . heparin 750 Units/hr (02/13/17 0733)  . [START ON 02/14/2017] levofloxacin (LEVAQUIN) IV    . nitroGLYCERIN 1.5 mcg/min (02/13/17 0956)   PRN Meds: acetaminophen **OR** acetaminophen, albuterol, diphenhydrAMINE, ipratropium, ondansetron **OR** ondansetron (ZOFRAN) IV  Time spent: 35 minutes  Author: Berle Mull, MD [facility] Pager: [phone] 02/13/2017 3:44 PM  If 7PM-7AM, please contact night-coverage at [email or web address], password [facility]

## 2017-02-13 NOTE — Progress Notes (Signed)
ANTICOAGULATION CONSULT NOTE - Follow Up Consult  Pharmacy Consult for Heparin  Indication: chest pain/ACS  No Known Allergies  Patient Measurements: Height: 5\' 11"  (180.3 cm) Weight: 118 lb 3.2 oz (53.6 kg) IBW/kg (Calculated) : 75.3  Vital Signs: Temp: 98 F (36.7 C) (05/14 0459) Temp Source: Oral (05/13 2039) BP: 141/87 (05/14 0630) Pulse Rate: 71 (05/14 0630)  Labs:  Recent Labs  02/12/17 2032 02/13/17 0053 02/13/17 0600  HGB 12.2*  --  11.4*  HCT 38.3*  --  35.7*  PLT 434*  --  405*  HEPARINUNFRC  --   --  0.24*  CREATININE 1.89*  --  1.55*  TROPONINI  --  1.99* 1.56*    Estimated Creatinine Clearance: 25 mL/min (A) (by C-G formula based on SCr of 1.55 mg/dL (H)).    Assessment: Heparin for NSTEMI, possible cath today, heparin level low  Goal of Therapy:  Heparin level 0.3-0.7 units/ml Monitor platelets by anticoagulation protocol: Yes   Plan:  -Inc heparin to 750 units/hr -1500 HL  Narda Bonds 02/13/2017,7:32 AM

## 2017-02-14 ENCOUNTER — Encounter (HOSPITAL_COMMUNITY)
Admission: EM | Disposition: A | Discharge status: Home Health | Payer: Self-pay | Source: Home / Self Care | Attending: Internal Medicine

## 2017-02-14 DIAGNOSIS — I255 Ischemic cardiomyopathy: Secondary | ICD-10-CM

## 2017-02-14 DIAGNOSIS — I248 Other forms of acute ischemic heart disease: Secondary | ICD-10-CM

## 2017-02-14 DIAGNOSIS — I25118 Atherosclerotic heart disease of native coronary artery with other forms of angina pectoris: Secondary | ICD-10-CM

## 2017-02-14 HISTORY — PX: LEFT HEART CATH AND CORONARY ANGIOGRAPHY: CATH118249

## 2017-02-14 LAB — BASIC METABOLIC PANEL
ANION GAP: 8 (ref 5–15)
BUN: 35 mg/dL — ABNORMAL HIGH (ref 6–20)
CALCIUM: 9.6 mg/dL (ref 8.9–10.3)
CO2: 24 mmol/L (ref 22–32)
Chloride: 105 mmol/L (ref 101–111)
Creatinine, Ser: 1.63 mg/dL — ABNORMAL HIGH (ref 0.61–1.24)
GFR, EST AFRICAN AMERICAN: 42 mL/min — AB (ref >60)
GFR, EST NON AFRICAN AMERICAN: 36 mL/min — AB (ref >60)
Glucose, Bld: 153 mg/dL — ABNORMAL HIGH (ref 65–99)
POTASSIUM: 4.4 mmol/L (ref 3.5–5.1)
SODIUM: 137 mmol/L (ref 135–145)

## 2017-02-14 LAB — CBC
HEMATOCRIT: 37.7 % — AB (ref 39.0–52.0)
HEMOGLOBIN: 12.1 g/dL — AB (ref 13.0–17.0)
MCH: 30.7 pg (ref 26.0–34.0)
MCHC: 32.1 g/dL (ref 30.0–36.0)
MCV: 95.7 fL (ref 78.0–100.0)
Platelets: 463 10*3/uL — ABNORMAL HIGH (ref 150–400)
RBC: 3.94 MIL/uL — AB (ref 4.22–5.81)
RDW: 13.8 % (ref 11.5–15.5)
WBC: 9.4 10*3/uL (ref 4.0–10.5)

## 2017-02-14 LAB — PROTIME-INR
INR: 1.06
PROTHROMBIN TIME: 13.8 s (ref 11.4–15.2)

## 2017-02-14 LAB — LEGIONELLA PNEUMOPHILA SEROGP 1 UR AG: L. pneumophila Serogp 1 Ur Ag: NEGATIVE

## 2017-02-14 LAB — HEPARIN LEVEL (UNFRACTIONATED): HEPARIN UNFRACTIONATED: 0.64 [IU]/mL (ref 0.30–0.70)

## 2017-02-14 SURGERY — LEFT HEART CATH AND CORONARY ANGIOGRAPHY
Anesthesia: LOCAL

## 2017-02-14 MED ORDER — ONDANSETRON HCL 4 MG/2ML IJ SOLN: 4.0000 mg | Freq: Four times a day (QID) | INTRAMUSCULAR | Status: DC | PRN

## 2017-02-14 MED ORDER — IOPAMIDOL (ISOVUE-370) INJECTION 76%
INTRAVENOUS | Status: AC
  Filled 2017-02-14: qty 100

## 2017-02-14 MED ORDER — ACETAMINOPHEN 325 MG PO TABS: 650.0000 mg | ORAL_TABLET | ORAL | Status: DC | PRN

## 2017-02-14 MED ORDER — HEPARIN (PORCINE) IN NACL 100-0.45 UNIT/ML-% IJ SOLN
850.0000 [IU]/h | INTRAMUSCULAR | Status: DC
  Administered 2017-02-15: 850 [IU]/h via INTRAVENOUS

## 2017-02-14 MED ORDER — SODIUM CHLORIDE 0.9 % IV SOLN: INTRAVENOUS | Status: AC

## 2017-02-14 MED ORDER — MIDAZOLAM HCL 2 MG/2ML IJ SOLN
INTRAMUSCULAR | Status: AC
  Filled 2017-02-14: qty 2

## 2017-02-14 MED ORDER — ISOSORBIDE MONONITRATE ER 30 MG PO TB24
30.0000 mg | ORAL_TABLET | Freq: Every day | ORAL | Status: DC
  Administered 2017-02-14 – 2017-02-16 (×3): 30 mg via ORAL
  Filled 2017-02-14 (×3): qty 1

## 2017-02-14 MED ORDER — HEPARIN (PORCINE) IN NACL 2-0.9 UNIT/ML-% IJ SOLN
INTRAMUSCULAR | Status: AC | PRN
  Administered 2017-02-14: 1000 mL

## 2017-02-14 MED ORDER — VERAPAMIL HCL 2.5 MG/ML IV SOLN
INTRAVENOUS | Status: AC
  Filled 2017-02-14: qty 2

## 2017-02-14 MED ORDER — LIDOCAINE HCL (PF) 1 % IJ SOLN
INTRAMUSCULAR | Status: AC
  Filled 2017-02-14: qty 30

## 2017-02-14 MED ORDER — MAGNESIUM HYDROXIDE 400 MG/5ML PO SUSP: 30.0000 mL | Freq: Once | ORAL | Status: DC

## 2017-02-14 MED ORDER — METOPROLOL SUCCINATE ER 50 MG PO TB24
50.0000 mg | ORAL_TABLET | Freq: Every day | ORAL | Status: DC
  Administered 2017-02-15 – 2017-02-16 (×2): 50 mg via ORAL
  Filled 2017-02-14 (×2): qty 1

## 2017-02-14 MED ORDER — SODIUM CHLORIDE 0.9 % IV SOLN: 250.0000 mL | INTRAVENOUS | Status: DC | PRN

## 2017-02-14 MED ORDER — ASPIRIN 81 MG PO CHEW
81.0000 mg | CHEWABLE_TABLET | Freq: Every day | ORAL | Status: DC
  Administered 2017-02-15 – 2017-02-16 (×2): 81 mg via ORAL
  Filled 2017-02-14 (×2): qty 1

## 2017-02-14 MED ORDER — VERAPAMIL HCL 2.5 MG/ML IV SOLN
INTRAVENOUS | Status: DC | PRN
  Administered 2017-02-14: 10 mL via INTRA_ARTERIAL

## 2017-02-14 MED ORDER — HEPARIN (PORCINE) IN NACL 2-0.9 UNIT/ML-% IJ SOLN
INTRAMUSCULAR | Status: AC
  Filled 2017-02-14: qty 1000

## 2017-02-14 MED ORDER — MIDAZOLAM HCL 2 MG/2ML IJ SOLN
INTRAMUSCULAR | Status: DC | PRN
  Administered 2017-02-14: 0.5 mg via INTRAVENOUS

## 2017-02-14 MED ORDER — ASPIRIN EC 81 MG PO TBEC: 81.0000 mg | DELAYED_RELEASE_TABLET | Freq: Every day | ORAL | Status: DC

## 2017-02-14 MED ORDER — HEPARIN SODIUM (PORCINE) 1000 UNIT/ML IJ SOLN
INTRAMUSCULAR | Status: DC | PRN
  Administered 2017-02-14: 2500 [IU] via INTRAVENOUS

## 2017-02-14 MED ORDER — OXYCODONE-ACETAMINOPHEN 5-325 MG PO TABS: 1.0000 | ORAL_TABLET | ORAL | Status: DC | PRN

## 2017-02-14 MED ORDER — SODIUM CHLORIDE 0.9% FLUSH
3.0000 mL | Freq: Two times a day (BID) | INTRAVENOUS | Status: DC
  Administered 2017-02-15 – 2017-02-16 (×2): 3 mL via INTRAVENOUS

## 2017-02-14 MED ORDER — FENTANYL CITRATE (PF) 100 MCG/2ML IJ SOLN
INTRAMUSCULAR | Status: DC | PRN
  Administered 2017-02-14: 25 ug via INTRAVENOUS

## 2017-02-14 MED ORDER — HEPARIN SODIUM (PORCINE) 1000 UNIT/ML IJ SOLN
INTRAMUSCULAR | Status: AC
  Filled 2017-02-14: qty 1

## 2017-02-14 MED ORDER — SODIUM CHLORIDE 0.9% FLUSH: 3.0000 mL | INTRAVENOUS | Status: DC | PRN

## 2017-02-14 MED ORDER — IOPAMIDOL (ISOVUE-370) INJECTION 76%
INTRAVENOUS | Status: DC | PRN
  Administered 2017-02-14: 80 mL via INTRAVENOUS

## 2017-02-14 MED ORDER — FENTANYL CITRATE (PF) 100 MCG/2ML IJ SOLN
INTRAMUSCULAR | Status: AC
  Filled 2017-02-14: qty 2

## 2017-02-14 SURGICAL SUPPLY — 15 items
CATH INFINITI 5 FR JL3.5 (CATHETERS) ×1 IMPLANT
CATH INFINITI JR4 5F (CATHETERS) ×1 IMPLANT
COVER PRB 48X5XTLSCP FOLD TPE (BAG) IMPLANT
COVER PROBE 5X48 (BAG) ×2
DEVICE RAD COMP TR BAND LRG (VASCULAR PRODUCTS) ×1 IMPLANT
GLIDESHEATH SLEND A-KIT 6F 22G (SHEATH) ×1 IMPLANT
GUIDEWIRE INQWIRE 1.5J.035X260 (WIRE) IMPLANT
INQWIRE 1.5J .035X260CM (WIRE) ×2
KIT ESSENTIALS PG (KITS) ×1 IMPLANT
KIT HEART LEFT (KITS) ×2 IMPLANT
PACK CARDIAC CATHETERIZATION (CUSTOM PROCEDURE TRAY) ×2 IMPLANT
TRANSDUCER W/STOPCOCK (MISCELLANEOUS) ×2 IMPLANT
TUBING CIL FLEX 10 FLL-RA (TUBING) ×2 IMPLANT
WIRE ASAHI PROWATER 180CM (WIRE) ×1 IMPLANT
WIRE HI TORQ VERSACORE-J 145CM (WIRE) ×1 IMPLANT

## 2017-02-14 NOTE — Progress Notes (Signed)
[facility] Progress Note  Patient: Craig Berry ZOX:[id number]   PCP: Lillard Anes, MD DOB: 11/05/1925   DOA: 02/12/2017   DOS: 02/14/2017   Date of Service: the patient was seen and examined on 02/14/2017  Subjective: feeling better, no chest pain, no fever or chills.  Brief hospital course: Pt. with PMH of active smoker, COPD, chronic respiratory failure on oxygen, CAD, HTN, PVD; admitted on 02/12/2017, presented with complaint of shortness of breath, was found to have acute hypoxic respiratory failure likely secondary to acute on chronic CHF as well as COPD exacerbation and non-STEMI. Currently further plan is cardiac catheterization tomorrow continue to treat COPD.  Assessment and Plan: 1. SIRS. Acute on chronic hypoxic respiratory failure. COPD exacerbation with bronchitis. Patient was given IV fluid Follow up on sputum culture as well as blood culture.No growth to date Patient was started on IV Levaquin which I will continue. Also started on IV Solu-Medrol 60 mg every 8 hours which I will continue. Continue duo nebs. Changing BiPAP only as needed, maintain oxygenation 88 and 92%. Continue Mucinex. Continue to monitor in the step down unit at present.  2. Acute systolic CHF. Non-STEMI. BNP is significantly elevated, although the patient is not clinically volume overloaded at present. EF 35% with significant wall motion abnormalities. Troponin trending downwards. Currently on IV heparin. Nitroglycerin infusion as well as Plavix and statin. Beta blockers added. Cardiac catheterization shows a significant blockage but difficult management as not appear to be a candidate for CABG per note from cardiology and lesions are difficult for PCI. We will await final recommendation. We'll continue to closely monitor at present.  3. Active smoker. History of COPD. Weight loss. Patient will require outpatient workup, related screening for lung cancer. Has extensive  history of smoking.  4. CKD III. Renal function stable,  Monitor BMP for any evidence of CIN.  Diet: cardiac diet DVT Prophylaxis: on therapeutic anticoagulation.  Advance goals of care discussion: full code  Family Communication: family was present at bedside, at the time of interview. The pt provided permission to discuss medical plan with the family. Opportunity was given to ask question and all questions were answered satisfactorily.   Disposition:  Discharge to home.  Consultants: cardiology Procedures: Echocardiogram, cardiac cath lab  Antibiotics: Anti-infectives    Start     Dose/Rate Route Frequency Ordered Stop   02/14/17 2200  levofloxacin (LEVAQUIN) IVPB 500 mg     500 mg 100 mL/hr over 60 Minutes Intravenous Every 48 hours 02/13/17 0006     02/12/17 2145  levofloxacin (LEVAQUIN) IVPB 750 mg  Status:  Discontinued     750 mg 100 mL/hr over 90 Minutes Intravenous Every 24 hours 02/12/17 2137 02/13/17 0006       Objective: Physical Exam: Vitals:   02/14/17 1645 02/14/17 1650 02/14/17 1655 02/14/17 1715  BP: (!) 162/78 (!) 159/75 (!) 154/83 (!) 149/75  Pulse: (!) 54 (!) 57 (!) 55   Resp: 20 18 16  (!) 22  Temp:    97.2 F (36.2 C)  TempSrc:    Oral  SpO2: 96% 97% 96%   Weight:      Height:        Intake/Output Summary (Last 24 hours) at 02/14/17 1722 Last data filed at 02/14/17 1445  Gross per 24 hour  Intake           351.29 ml  Output             1220 ml  Net          -  868.71 ml   Filed Weights   02/12/17 2040 02/13/17 0032 02/14/17 0427  Weight: 53.5 kg (118 lb) 53.6 kg (118 lb 3.2 oz) 50.2 kg (110 lb 9.6 oz)  General: Alert, Awake and Oriented to Time, Place and Person. Appear in mild distress, affect appropriate Eyes: PERRL, Conjunctiva normal ENT: Oral Mucosa clear moist. Neck: no JVD, no Abnormal Mass Or lumps Cardiovascular: S1 and S2 Present, no Murmur, Peripheral Pulses Present Respiratory: Bilateral Air entry equal and Decreased, no  use of accessory muscle, no Crackles, bilateral wheezes Abdomen: Bowel Sound present, Soft and no tenderness Skin: redness no, no Rash, no induration Extremities: no Pedal edema, no calf tenderness Neurologic: Grossly no focal neuro deficit. Bilaterally Equal motor strength  Data Reviewed: CBC:  Recent Labs Lab 02/12/17 2032 02/13/17 0600 02/14/17 0419  WBC 13.0* 8.0 9.4  NEUTROABS 10.6*  --   --   HGB 12.2* 11.4* 12.1*  HCT 38.3* 35.7* 37.7*  MCV 96.7 96.7 95.7  PLT 434* 405* 935*   Basic Metabolic Panel:  Recent Labs Lab 02/12/17 2032 02/13/17 0600 02/14/17 0419  NA 136 136 137  K 4.9 4.8 4.4  CL 101 102 105  CO2 25 25 24   GLUCOSE 143* 140* 153*  BUN 27* 27* 35*  CREATININE 1.89* 1.55* 1.63*  CALCIUM 9.8 9.4 9.6    Liver Function Tests: No results for input(s): AST, ALT, ALKPHOS, BILITOT, PROT, ALBUMIN in the last 168 hours. No results for input(s): LIPASE, AMYLASE in the last 168 hours. No results for input(s): AMMONIA in the last 168 hours. Coagulation Profile:  Recent Labs Lab 02/14/17 0419  INR 1.06   Cardiac Enzymes:  Recent Labs Lab 02/13/17 0053 02/13/17 0600 02/13/17 1543  TROPONINI 1.99* 1.56* 1.07*   BNP (last 3 results) No results for input(s): PROBNP in the last 8760 hours. CBG: No results for input(s): GLUCAP in the last 168 hours. Studies: No results found.  Scheduled Meds: . arformoterol  15 mcg Nebulization BID  . aspirin  81 mg Oral Daily  . atorvastatin  80 mg Oral QHS  . budesonide (PULMICORT) nebulizer solution  0.5 mg Nebulization BID  . isosorbide mononitrate  30 mg Oral Daily  . mouth rinse  15 mL Mouth Rinse BID  . methylPREDNISolone (SOLU-MEDROL) injection  60 mg Intravenous Q8H  . metoprolol tartrate  25 mg Oral BID  . nicotine  21 mg Transdermal Daily  . sodium chloride flush  3 mL Intravenous Q12H   Continuous Infusions: . sodium chloride    . sodium chloride    . [START ON 02/15/2017] heparin    .  levofloxacin (LEVAQUIN) IV     PRN Meds: sodium chloride, acetaminophen **OR** acetaminophen, acetaminophen, albuterol, diphenhydrAMINE, ipratropium, ondansetron **OR** ondansetron (ZOFRAN) IV, ondansetron (ZOFRAN) IV, oxyCODONE-acetaminophen, sodium chloride flush  Time spent: 30 minutes  Author: Berle Mull, MD [facility] Pager: [phone] 02/14/2017 5:22 PM  If 7PM-7AM, please contact night-coverage at [email or web address], password [facility]

## 2017-02-14 NOTE — H&P (View-Only) (Signed)
Progress Note  Patient Name: Craig Berry Date of Encounter: 02/13/2017  Primary Cardiologist: Bettina Gavia  Subjective   Patient seen and examined this morning.  No acute complaints.  He is off BiPap and now on nasal cannula.  Denies chest pain, orhopnea, PND.  Reports breathing is much improved.  Inpatient Medications    Scheduled Meds: . arformoterol  15 mcg Nebulization BID  . atorvastatin  80 mg Oral q1800  . budesonide (PULMICORT) nebulizer solution  0.5 mg Nebulization BID  . clopidogrel  75 mg Oral QHS  . methylPREDNISolone (SOLU-MEDROL) injection  60 mg Intravenous Q8H  . metoprolol tartrate  25 mg Oral BID  . nicotine  21 mg Transdermal Daily   Continuous Infusions: . heparin 750 Units/hr (02/13/17 0733)  . [START ON 02/14/2017] levofloxacin (LEVAQUIN) IV     PRN Meds: acetaminophen **OR** acetaminophen, albuterol, diphenhydrAMINE, ipratropium, ondansetron **OR** ondansetron (ZOFRAN) IV   Vital Signs    Vitals:   02/13/17 0500 02/13/17 0530 02/13/17 0630 02/13/17 0733  BP: 124/90 135/80 (!) 141/87   Pulse: 70 69 71   Resp:  17 (!) 21   Temp:      TempSrc:      SpO2: 99% 99% 99% 97%  Weight:      Height:        Intake/Output Summary (Last 24 hours) at 02/13/17 0927 Last data filed at 02/13/17 0502  Gross per 24 hour  Intake                0 ml  Output                0 ml  Net                0 ml   Filed Weights   02/12/17 2040 02/13/17 0032  Weight: 118 lb (53.5 kg) 118 lb 3.2 oz (53.6 kg)    Telemetry    Sinus rhythm  ECG    5/13 - normal sinus with probably old anteroseptal infarct  Physical Exam   GEN: No acute distress, chronically ill appearing, on nasal cannula 2 liters ; thin, frail appearing Neck: No JVD, no bruit Cardiac: distant heart sounds, RRR, no murmurs Respiratory: diminished, mild expiratory wheeze and diffusely coarse.  GI: Soft, nontender, non-distended  MS: No edema; No deformity. Neuro:  Nonfocal  Psych: Normal  affect   Labs    Chemistry Recent Labs Lab 02/12/17 2032 02/13/17 0600  NA 136 136  K 4.9 4.8  CL 101 102  CO2 25 25  GLUCOSE 143* 140*  BUN 27* 27*  CREATININE 1.89* 1.55*  CALCIUM 9.8 9.4  GFRNONAA 30* 38*  GFRAA 35* 44*  ANIONGAP 10 9     Hematology Recent Labs Lab 02/12/17 2032 02/13/17 0600  WBC 13.0* 8.0  RBC 3.96* 3.69*  HGB 12.2* 11.4*  HCT 38.3* 35.7*  MCV 96.7 96.7  MCH 30.8 30.9  MCHC 31.9 31.9  RDW 14.3 14.3  PLT 434* 405*    Cardiac Enzymes Recent Labs Lab 02/13/17 0053 02/13/17 0600  TROPONINI 1.99* 1.56*    Recent Labs Lab 02/12/17 2041  TROPIPOC 1.54*     BNP Recent Labs Lab 02/12/17 2035  BNP 635.3*     DDimer No results for input(s): DDIMER in the last 168 hours.   Radiology    Dg Chest Port 1 View  Result Date: 02/12/2017 CLINICAL DATA:  83 year old male with acute shortness of breath tonight. EXAM: PORTABLE CHEST 1  VIEW COMPARISON:  02/13/2012 chest CT and prior exams. FINDINGS: Mild cardiomegaly and emphysema again noted. Biapical pleural-parenchymal scarring again noted. There is no evidence of focal airspace disease, pulmonary edema, suspicious pulmonary nodule/mass, pleural effusion, or pneumothorax. No acute bony abnormalities are identified. IMPRESSION: No evidence of acute cardiopulmonary disease. Emphysema and mild cardiomegaly. Electronically Signed   By: Margarette Canada M.D.   On: 02/12/2017 21:11    Cardiac Studies   Echo  -Images personally reviewed at bedside by M.D. I agree with these findings. - Left ventricle: The cavity size was normal. Wall thickness was   normal. Systolic function was moderately reduced. The estimated   ejection fraction was in the range of 35% to 40%. There is   akinesis of the mid-apical anteroseptal, lateral, inferoseptal,   and apical myocardium. There is normal contraction of basal   myocardium. Doppler parameters are consistent with abnormal left   ventricular relaxation (grade 1  diastolic dysfunction). Acoustic   contrast opacification revealed no evidence ofthrombus. - Mitral valve: Calcified annulus. There was mild regurgitation. - Right atrium: The atrium was mildly dilated. - Tricuspid valve: There was mild regurgitation.  Patient Profile     83 y.o. male with CAD, severe COPD, HTN, ongoing tobacco abuse here with acute hypoxic respiratory failure and elevated troponin along with what appears to be ischemic cardiomyopathy with EF of 35-40% with anterior hypo-/akinesis.  Assessment & Plan    # CAD # NSTEMI, suspect type 2 - patient with several risk factors for cardiac disease and some degree (although severity unclear) of CAD here with elevated troponin (1.99 >> 1.56).  BNP also elevated > 600.  However, does not appear to be volume overloaded.  Denies any chest pressure or pain throughout course of his illness.  Suspect may be ischemia due to supply-demand mismatch from severe COPD exacerbation and elevated blood pressures .  He is on heparin gtt, NTG gtt, plavix, statin, and now beta blocker.  At home, on asa and plavix plus statin.  Echo results as above.  Will plan for invasive cardiac catheterization tomorrow to rule out any obstructive CAD lesions to act upon.  HFrEF - acute combined systolic and diastolic heart failure -unknown of chronic - Echo results as above with BNP over 600.  No signs of significant volume overload on exam.  # Acute hypoxic respiratory failure secondary to COPD exacerbation - management per primary service, currently off BiPap  # Hyperlipidemia - on statin at home.  Converted to  atorvastatin here.  Lipid panel with TC 152, HDL 61, LDL 77  # HTN - SBP initially running as high as 180s.  Currently better in the 140s.  Appears just on amlodipine at home.  On NTG gtt this morning and started on metoprolol as well.   Signed, Jule Ser, DO  02/13/2017, 9:27 AM     I have seen, examined and evaluated the patient this Afternoon  along with Dr. Juleen China.  After reviewing all the available data and chart, we discussed the patients laboratory, study & physical findings as well as symptoms in detail. I agree with his findings, examination as well as impression recommendations as per our discussion.    My findings noted in Italics  Overall really hard to figure out what course of events occurred. He has a documented history of "coronary disease, however I cannot find any cath reports. He himself does not recall ever having intervention done.  He now presents with acute hypoxic respiratory failure now associated  with a troponin elevation and abnormal echocardiogram suggestive of anterior/LAD disease. This could either be demand ischemia/infarction in the setting of hypoxemia and existing disease versus mild non-STEMI causing exacerbation of COPD resulting in acute combined systolic and diastolic heart failure.  At this point, I think really the main question is is there is are not coronary disease. Had a long frank discussion with the patient and his daughter. Although he is very weak and debilitated by his chronic COPD requiring oxygen at home, he still goes out and does things like mowing the lawn and is trying to be active. As a result, we decided that based on the echocardiogram findings suggesting LAD reasonable decision to proceed with cardiac catheterization and possible PCI.  He will continue on IV heparin tonight. Nothing by mouth after midnight for planned catheterization possible PCI tomorrow.  Performing MD:  [facility] (Dr. Tamala Julian).  Procedure:  Left Heart Catheterization with Native Coronary Angiography and Possible Percutaneous Coronary Intervention  The procedure with Risks/Benefits/Alternatives and Indications was reviewed with the patient & daughter.  All questions were answered.    Risks / Complications include, but not limited to: Death, MI, CVA/TIA, VF/VT (with defibrillation), Bradycardia (need for temporary pacer  placement), contrast induced nephropathy, bleeding / bruising / hematoma / pseudoaneurysm, vascular or coronary injury (with possible emergent CT or Vascular Surgery), adverse medication reactions, infection.  Additional risks involving the use of radiation with the possibility of radiation burns and cancer were explained in detail.  The patient (and family) voice understanding and agree to proceed.  \   Glenetta Hew, M.D., M.S. Interventional Cardiologist   Pager # [phone] Phone # [phone] [address] [place], [place] [address]

## 2017-02-14 NOTE — Progress Notes (Signed)
ANTICOAGULATION CONSULT NOTE - Follow Up Consult  Pharmacy Consult for Heparin  Indication: chest pain/ACS  No Known Allergies  Patient Measurements: Height: 5\' 11"  (180.3 cm) Weight: 110 lb 9.6 oz (50.2 kg) IBW/kg (Calculated) : 75.3  Vital Signs: Temp: 97.2 F (36.2 C) (05/15 1715) Temp Source: Oral (05/15 1715) BP: 149/75 (05/15 1715) Pulse Rate: 55 (05/15 1655)  Labs:  Recent Labs  02/12/17 2032 02/13/17 0053 02/13/17 0600 02/13/17 1543 02/14/17 0419  HGB 12.2*  --  11.4*  --  12.1*  HCT 38.3*  --  35.7*  --  37.7*  PLT 434*  --  405*  --  463*  LABPROT  --   --   --   --  13.8  INR  --   --   --   --  1.06  HEPARINUNFRC  --   --  0.24* 0.28* 0.64  CREATININE 1.89*  --  1.55*  --  1.63*  TROPONINI  --  1.99* 1.56* 1.07*  --     Estimated Creatinine Clearance: 22.2 mL/min (A) (by C-G formula based on SCr of 1.63 mg/dL (H)).  Assessment: To continue heparin post cath 8 hours post sheath removal at 1600 pm  Goal of Therapy:  Heparin level 0.3-0.7 units/ml Monitor platelets by anticoagulation protocol: Yes   Plan:  Heparin at 850 units / hr starting at midnight 8 am heparin level   Thank you Anette Guarneri, PharmD [phone]  02/14/2017,5:20 PM

## 2017-02-14 NOTE — Progress Notes (Signed)
ANTICOAGULATION CONSULT NOTE - Follow Up Consult  Pharmacy Consult for Heparin  Indication: chest pain/ACS  No Known Allergies  Patient Measurements: Height: 5\' 11"  (180.3 cm) Weight: 110 lb 9.6 oz (50.2 kg) IBW/kg (Calculated) : 75.3  Vital Signs: Temp: 97.9 F (36.6 C) (05/15 0427) Temp Source: Axillary (05/15 0427) BP: 154/83 (05/15 0814) Pulse Rate: 68 (05/15 0814)  Labs:  Recent Labs  02/12/17 2032 02/13/17 0053 02/13/17 0600 02/13/17 1543 02/14/17 0419  HGB 12.2*  --  11.4*  --  12.1*  HCT 38.3*  --  35.7*  --  37.7*  PLT 434*  --  405*  --  463*  LABPROT  --   --   --   --  13.8  INR  --   --   --   --  1.06  HEPARINUNFRC  --   --  0.24* 0.28* 0.64  CREATININE 1.89*  --  1.55*  --  1.63*  TROPONINI  --  1.99* 1.56* 1.07*  --     Estimated Creatinine Clearance: 22.2 mL/min (A) (by C-G formula based on SCr of 1.63 mg/dL (H)).  Assessment: 83 y/o male on heparin drip for NSTEMI. Plan for cath today. Heparin level is therapeutic at 0.64 (upper end of normal). Will decrease drip rate to target middle range. No bleeding noted, CBC stable.  Goal of Therapy:  Heparin level 0.3-0.7 units/ml Monitor platelets by anticoagulation protocol: Yes   Plan:  Decrease heparin drip to 850 units/hr Daily heparin level and CBC Monitor for s/sx of bleeding F/u after cath Resume aspirin?   Renold Genta, PharmD, BCPS Clinical Pharmacist Phone for today - [place] - [phone] 02/14/2017 9:10 AM

## 2017-02-14 NOTE — Progress Notes (Signed)
Patient states he feels much better and does not feel like he needs the BIPAP tonight. Bipap is on standby at bedside. RT will continue to monitor.

## 2017-02-14 NOTE — Interval H&P Note (Signed)
Cath Lab Visit (complete for each Cath Lab visit)  Clinical Evaluation Leading to the Procedure:   ACS: Yes.    Non-ACS:    Anginal Classification: CCS II  Anti-ischemic medical therapy: Minimal Therapy (1 class of medications)  Non-Invasive Test Results: No non-invasive testing performed  Prior CABG: No previous CABG      History and Physical Interval Note:  02/14/2017 2:37 PM  Leanne Chang  has presented today for surgery, with the diagnosis of cp  The various methods of treatment have been discussed with the patient and family. After consideration of risks, benefits and other options for treatment, the patient has consented to  Procedure(s): Left Heart Cath and Coronary Angiography (N/A) as a surgical intervention .  The patient's history has been reviewed, patient examined, no change in status, stable for surgery.  I have reviewed the patient's chart and labs.  Questions were answered to the patient's satisfaction.     Belva Crome III

## 2017-02-14 NOTE — Progress Notes (Signed)
Progress Note  Patient Name: Craig Berry Date of Encounter: 02/14/2017  Primary Cardiologist: Bettina Gavia  Subjective   Patient seen and examined this morning.  Daughter at bedside.  Brief episode of respiratory distress overnight when attempting to use bathroom and getting tangled up in lines and wires.  Wore BiPap about 1 hour.  Back to baseline this morning.  Denies chest pain or current SOB.  Inpatient Medications    Scheduled Meds: . arformoterol  15 mcg Nebulization BID  . atorvastatin  80 mg Oral QHS  . budesonide (PULMICORT) nebulizer solution  0.5 mg Nebulization BID  . clopidogrel  75 mg Oral QHS  . mouth rinse  15 mL Mouth Rinse BID  . methylPREDNISolone (SOLU-MEDROL) injection  60 mg Intravenous Q8H  . metoprolol tartrate  25 mg Oral BID  . nicotine  21 mg Transdermal Daily   Continuous Infusions: . sodium chloride 10 mL/hr at 02/14/17 0604  . heparin 850 Units/hr (02/14/17 0949)  . levofloxacin (LEVAQUIN) IV    . nitroGLYCERIN 10 mcg/min (02/14/17 0400)   PRN Meds: acetaminophen **OR** acetaminophen, albuterol, diphenhydrAMINE, ipratropium, ondansetron **OR** ondansetron (ZOFRAN) IV   Vital Signs    Vitals:   02/14/17 0433 02/14/17 0532 02/14/17 0814 02/14/17 1028  BP:  (!) 156/87 (!) 154/83 (!) 147/93  Pulse: 60 (!) 59 68   Resp: (!) 22 20 (!) 21   Temp:    97.5 F (36.4 C)  TempSrc:    Oral  SpO2: 99% 97% 95%   Weight:      Height:        Intake/Output Summary (Last 24 hours) at 02/14/17 1121 Last data filed at 02/14/17 0435  Gross per 24 hour  Intake           791.93 ml  Output             1020 ml  Net          -228.07 ml   Filed Weights   02/12/17 2040 02/13/17 0032 02/14/17 0427  Weight: 118 lb (53.5 kg) 118 lb 3.2 oz (53.6 kg) 110 lb 9.6 oz (50.2 kg)    Telemetry    Sinus rhythm  ECG    5/13 - normal sinus with probably old anteroseptal infarct  Physical Exam   GEN: No acute distress, thin, frail, chronically ill appearing, on  nasal cannula  Neck: No JVD Cardiac: distant heart sounds, RRR, no murmurs Respiratory: diminished, mild expiratory wheezes, otherwise improved.  GI: Soft, nontender, non-distended  MS: No edema; No deformity. Neuro:  Nonfocal  Psych: Normal affect   Labs    Chemistry  Recent Labs Lab 02/12/17 2032 02/13/17 0600 02/14/17 0419  NA 136 136 137  K 4.9 4.8 4.4  CL 101 102 105  CO2 25 25 24   GLUCOSE 143* 140* 153*  BUN 27* 27* 35*  CREATININE 1.89* 1.55* 1.63*  CALCIUM 9.8 9.4 9.6  GFRNONAA 30* 38* 36*  GFRAA 35* 44* 42*  ANIONGAP 10 9 8      Hematology  Recent Labs Lab 02/12/17 2032 02/13/17 0600 02/14/17 0419  WBC 13.0* 8.0 9.4  RBC 3.96* 3.69* 3.94*  HGB 12.2* 11.4* 12.1*  HCT 38.3* 35.7* 37.7*  MCV 96.7 96.7 95.7  MCH 30.8 30.9 30.7  MCHC 31.9 31.9 32.1  RDW 14.3 14.3 13.8  PLT 434* 405* 463*    Cardiac Enzymes  Recent Labs Lab 02/13/17 0053 02/13/17 0600 02/13/17 1543  TROPONINI 1.99* 1.56* 1.07*  Recent Labs Lab 02/12/17 2041  TROPIPOC 1.54*     BNP  Recent Labs Lab 02/12/17 2035  BNP 635.3*     DDimer No results for input(s): DDIMER in the last 168 hours.   Radiology    Dg Chest Port 1 View  Result Date: 02/12/2017 CLINICAL DATA:  83 year old male with acute shortness of breath tonight. EXAM: PORTABLE CHEST 1 VIEW COMPARISON:  02/13/2012 chest CT and prior exams. FINDINGS: Mild cardiomegaly and emphysema again noted. Biapical pleural-parenchymal scarring again noted. There is no evidence of focal airspace disease, pulmonary edema, suspicious pulmonary nodule/mass, pleural effusion, or pneumothorax. No acute bony abnormalities are identified. IMPRESSION: No evidence of acute cardiopulmonary disease. Emphysema and mild cardiomegaly. Electronically Signed   By: Margarette Canada M.D.   On: 02/12/2017 21:11    Cardiac Studies    Echo 02/13/2017: Normal LV size and thickness. Moderately reduced LV function with EF 35-40%. Mid-apical  anteroseptal, lateral, inferoseptal and apical myocardial akinesis." GR 1 DD". No LV thrombus mild MR Results personally reviewed   Cardiac Catheterization 02/14/17: I personally reviewed the films with Dr. Tamala Julian and Dr. Juleen China in the Cath Lab  I personally agree with the recommendations noted above. This is not approachable from PCI standpoint  Conclusion    Severe calcific and far advanced coronary artery disease - from left main into mid LAD  70-80% distal left main. (With 40% ostial left main)  Segmental 85-95% ostial to proximal LAD.  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 The aneurysm is approximately 6-7 mm in diameter  Severe ostial to proximal circumflex disease.  Moderate ramus intermedius.  70% proximal and 70% mid stenoses in the dominant right coronary.  Normal left ventricular end-diastolic pressure. Ventriculography was not performed because of significant kidney impairment.  RECOMMENDATIONS:   Surgical anatomy but not likely a surgical candidate  PCI options are markedly limited due to combination of abnormalities as noted above including the large saccular aneurysm within the treatment segment.   Diagnostic Diagram - what is not shown is the large saccular aneurysm at the bifurcation of D1 and the LAD.     Patient Profile     83 y.o. male with CAD, severe COPD, HTN, ongoing tobacco abuse here with acute hypoxic respiratory failure and elevated troponin along with what appears to be ischemic cardiomyopathy with EF of 35-40% with anterior hypo-/akinesis.  Assessment & Plan    # CAD # NSTEMI, suspect type 2 - given low level of troponin elevation with extensive wall motion abnormalities and the extent of CAD noted. This was probably demand ischemia # HLD - documented CAD in the chart but no clear determination as to the severity of disease.  Patient does not recall prior history of coronary  intervention and no cath reports are in our EMR.  He does have a surface Echo this admission with LVEF 35-40% with akinesis of the mid-apical anteroseptal, lateral, inferoseptal, and apical myocardium.  Presented with elevated troponin in the setting of COPD exacerbation and elevation in blood pressures.  Presentation suspicious that may be ischemia due to supply-demand mismatch.  - For invasive cardiac catheterization today which revealed multi-vessel CAD (with no PCI option).    Further plans regarding any revascularization pending discussion with patient and family.  Will consider CT surgery consultation based upon this. -- Restarting heparin pending surgical evaluation - Continue statin - Continue beta blocker - consider converting to carvedilol , or at least converting  to Toprol to achieve beta 1 selectivity - Resume ASA 81mg  daily - Hold Plavix pending discussion re: any surgical intervention - Stop NTG gtt and heparin gtt - Start Imdur 30mg  daily  # HTN - On amlodipine at home.  Currently on metoprolol 25mg  BID.  If BP remains elevated could consider switch to Carvedilol.  # Acute Combined Systolic and Diastolic HF of Unknown Chronicity / ischemic cardiomyopathy -- I don't think that all of this reduced EF comes from this current event. - Echo results as above with BNP over 600.  No signs of significant volume overload on current exam.  He is currently on a beta blocker - consider converting to carvedilol versus Toprol  Would consider converting from home amlodipine dose to an ARB  # Acute hypoxic respiratory failure secondary to COPD exacerbation - management per primary service - on nebs, steroids, and antibiotics  # Chronic Kidney Disease - admitted at 1.9 with no prior baseline comparison - improved to 1.6 this morning - follow after cath   Signed, Jule Ser, DO  02/14/2017, 11:21 AM     I have seen, examined and evaluated the patient this AM (& then in the Cath lab  this PM) along with Dr. Juleen China.  After reviewing all the available data and chart, we discussed the patients laboratory, study & physical findings as well as symptoms in detail. I agree with his findings, examination as well as impression recommendations as per our discussion.    My additions/corrections in italics.  When I saw Mr. Schum this morning he was doing relatively well with no major complaints. Not having any issues with dyspnea. I then went to the Cath Lab to see his films with Dr. Tamala Julian and Dr. Juleen China. Very severe surgical level disease, however I agree that he is not likely to be a good CABG candidate. However, I think we do know him and his family the opportunity to have a surgical evaluation. Most likely his level of COPD wearing home oxygen and age with borderline malnutrition would probably exclude him from CABG. In this case, medical management as our only option.  - We will now move to optimize medical management including converting from metoprolol to carvedilol, convert from nitroglycerin drip to Imdur for antianginal effect. Consider Ranexa. - He is currently on amlodipine at home, would like to consider converting to ARB (or simply adding ARB if blood pressure will tolerate) - Although he appears relatively euvolemic currently, he will probably need a diuretic for discharge - we may simply use spironolactone with when necessary Lasix - If no CABG his options provided, we would then restart Plavix.  If medical management is our only option, we may want to circumflex the potential of palliative care evaluation to determine if he would be of candidate for possible home hospice on discharge. -- I have not discussed this with the patient and family yet.   Glenetta Hew, M.D., M.S. Interventional Cardiologist   Pager # [phone] Phone # [phone] [address] [place], [place] [address]

## 2017-02-14 NOTE — Progress Notes (Signed)
   Pt seen & examined briefly - no new recommendations.   Cardiac Cath planned for today. -- More recommendations pending results.  Glenetta Hew, MD

## 2017-02-15 ENCOUNTER — Encounter (HOSPITAL_COMMUNITY): Payer: Self-pay | Admitting: Interventional Cardiology

## 2017-02-15 DIAGNOSIS — I25110 Atherosclerotic heart disease of native coronary artery with unstable angina pectoris: Secondary | ICD-10-CM

## 2017-02-15 DIAGNOSIS — F17200 Nicotine dependence, unspecified, uncomplicated: Secondary | ICD-10-CM

## 2017-02-15 DIAGNOSIS — J9621 Acute and chronic respiratory failure with hypoxia: Secondary | ICD-10-CM

## 2017-02-15 DIAGNOSIS — J449 Chronic obstructive pulmonary disease, unspecified: Secondary | ICD-10-CM

## 2017-02-15 LAB — BASIC METABOLIC PANEL
Anion gap: 6 (ref 5–15)
BUN: 43 mg/dL — ABNORMAL HIGH (ref 6–20)
CHLORIDE: 107 mmol/L (ref 101–111)
CO2: 25 mmol/L (ref 22–32)
Calcium: 9.1 mg/dL (ref 8.9–10.3)
Creatinine, Ser: 1.48 mg/dL — ABNORMAL HIGH (ref 0.61–1.24)
GFR calc Af Amer: 47 mL/min — ABNORMAL LOW (ref >60)
GFR calc non Af Amer: 40 mL/min — ABNORMAL LOW (ref >60)
Glucose, Bld: 144 mg/dL — ABNORMAL HIGH (ref 65–99)
POTASSIUM: 4.6 mmol/L (ref 3.5–5.1)
SODIUM: 138 mmol/L (ref 135–145)

## 2017-02-15 LAB — HEPARIN LEVEL (UNFRACTIONATED): HEPARIN UNFRACTIONATED: 0.46 [IU]/mL (ref 0.30–0.70)

## 2017-02-15 LAB — CBC
HEMATOCRIT: 34.5 % — AB (ref 39.0–52.0)
HEMOGLOBIN: 10.9 g/dL — AB (ref 13.0–17.0)
MCH: 30.3 pg (ref 26.0–34.0)
MCHC: 31.6 g/dL (ref 30.0–36.0)
MCV: 95.8 fL (ref 78.0–100.0)
Platelets: 408 10*3/uL — ABNORMAL HIGH (ref 150–400)
RBC: 3.6 MIL/uL — AB (ref 4.22–5.81)
RDW: 14 % (ref 11.5–15.5)
WBC: 10 10*3/uL (ref 4.0–10.5)

## 2017-02-15 MED ORDER — METHYLPREDNISOLONE SODIUM SUCC 40 MG IJ SOLR
40.0000 mg | Freq: Two times a day (BID) | INTRAMUSCULAR | Status: DC
  Administered 2017-02-15: 40 mg via INTRAVENOUS
  Filled 2017-02-15: qty 1

## 2017-02-15 MED ORDER — LEVOFLOXACIN 500 MG PO TABS: 500.0000 mg | ORAL_TABLET | ORAL | Status: DC

## 2017-02-15 MED FILL — Lidocaine HCl Local Preservative Free (PF) Inj 1%: INTRAMUSCULAR | Qty: 30 | Status: AC

## 2017-02-15 NOTE — Progress Notes (Signed)
Progress Note  Patient Name: Craig Berry Date of Encounter: 02/15/2017  Primary Cardiologist: Bettina Gavia  Subjective   Patient seen and examined this morning.  Daughter at bedside.  Cath results and revascularization vs medical management options discussed at length with daughter and goals moving forward.  Patient comfortable this morning without further complaints of CP.  SOB improved.  Inpatient Medications    Scheduled Meds: . arformoterol  15 mcg Nebulization BID  . aspirin  81 mg Oral Daily  . atorvastatin  80 mg Oral QHS  . budesonide (PULMICORT) nebulizer solution  0.5 mg Nebulization BID  . isosorbide mononitrate  30 mg Oral Daily  . mouth rinse  15 mL Mouth Rinse BID  . methylPREDNISolone (SOLU-MEDROL) injection  40 mg Intravenous Q12H  . metoprolol succinate  50 mg Oral Daily  . nicotine  21 mg Transdermal Daily  . sodium chloride flush  3 mL Intravenous Q12H   Continuous Infusions: . sodium chloride    . heparin 850 Units/hr (02/15/17 0026)   PRN Meds: sodium chloride, acetaminophen **OR** acetaminophen, albuterol, diphenhydrAMINE, ipratropium, ondansetron **OR** ondansetron (ZOFRAN) IV, sodium chloride flush   Vital Signs    Vitals:   02/15/17 0753 02/15/17 0944 02/15/17 0949 02/15/17 1128  BP: (!) 108/49   137/80  Pulse: 68   66  Resp: (!) 24   (!) 21  Temp: 97.7 F (36.5 C)   99 F (37.2 C)  TempSrc: Oral   Oral  SpO2: 100% 98% 98% 94%  Weight:      Height:        Intake/Output Summary (Last 24 hours) at 02/15/17 1305 Last data filed at 02/15/17 0945  Gross per 24 hour  Intake           491.18 ml  Output              200 ml  Net           291.18 ml   Filed Weights   02/13/17 0032 02/14/17 0427 02/15/17 0411  Weight: 118 lb 3.2 oz (53.6 kg) 110 lb 9.6 oz (50.2 kg) 116 lb 6.4 oz (52.8 kg)    ECG    5/13 - normal sinus with probably old anteroseptal infarct  Physical Exam   GEN: No acute distress, thin, frail, chronically ill appearing,  on nasal cannula, resting comfortably in bed Neck: No JVD Cardiac: distant heart sounds, RRR, no murmurs Respiratory: diminished with expiratory wheezes GI: Soft, nontender, non-distended  MS: No edema; No deformity. Neuro:  Nonfocal  Psych: Normal affect and approrpriate  Labs    Chemistry  Recent Labs Lab 02/13/17 0600 02/14/17 0419 02/15/17 0534  NA 136 137 138  K 4.8 4.4 4.6  CL 102 105 107  CO2 25 24 25   GLUCOSE 140* 153* 144*  BUN 27* 35* 43*  CREATININE 1.55* 1.63* 1.48*  CALCIUM 9.4 9.6 9.1  GFRNONAA 38* 36* 40*  GFRAA 44* 42* 47*  ANIONGAP 9 8 6      Hematology  Recent Labs Lab 02/13/17 0600 02/14/17 0419 02/15/17 0534  WBC 8.0 9.4 10.0  RBC 3.69* 3.94* 3.60*  HGB 11.4* 12.1* 10.9*  HCT 35.7* 37.7* 34.5*  MCV 96.7 95.7 95.8  MCH 30.9 30.7 30.3  MCHC 31.9 32.1 31.6  RDW 14.3 13.8 14.0  PLT 405* 463* 408*    Cardiac Enzymes  Recent Labs Lab 02/13/17 0053 02/13/17 0600 02/13/17 1543  TROPONINI 1.99* 1.56* 1.07*     Recent Labs Lab  02/12/17 2041  TROPIPOC 1.54*     BNP  Recent Labs Lab 02/12/17 2035  BNP 635.3*     DDimer No results for input(s): DDIMER in the last 168 hours.   Radiology    No results found.  Cardiac Studies    Echo 02/13/2017: Normal LV size and thickness. Moderately reduced LV function with EF 35-40%. Mid-apical anteroseptal, lateral, inferoseptal and apical myocardial akinesis." GR 1 DD". No LV thrombus mild MR.    Severe calcific and far advanced coronary artery disease - from left main into mid LAD  70-80% distal left main. (With 40% ostial left main)  Segmental 85-95% ostial to proximal LAD.  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 The aneurysm is approximately 6-7 mm in diameter  Severe ostial to proximal circumflex disease.  Moderate ramus intermedius.  70% proximal and 70% mid stenoses in the dominant right coronary.  Normal left  ventricular end-diastolic pressure. Ventriculography was not performed because of significant kidney impairment.  RECOMMENDATIONS:   Surgical anatomy but not likely a surgical candidate  PCI options are markedly limited due to combination of abnormalities as noted above including the large saccular aneurysm within the treatment segment.   Diagnostic Diagram - what is not shown is the large saccular aneurysm at the bifurcation of D1 and the LAD.     Patient Profile     83 y.o. male with CAD, severe COPD, HTN, ongoing tobacco abuse here with acute hypoxic respiratory failure and elevated troponin along with what appears to be ischemic cardiomyopathy with EF of 35-40% with anterior hypo-/akinesis.  Assessment & Plan    # Severe and calcified coronary artery disease # NSTEMI, type 2.  Given low troponin elevation level with extensive CAD and wall motion abnormalities this was probably demand ischemia mediated by primary lung issues. # HLD - he has severe and calcified CAD not amenable to any PCI options.  Given his advanced comorbidities he is likely not a surgical candidate, but we have asked for CT surgery to evaluate him and give their recommendations.   - We will stop heparin at this time - will continue to hold Plavix pending CT surgery evaluation - his care was discussed with daughter at length this morning regarding medication and symptoms management going forward in order to improve quality of life if surgery is not planned - continue statin and ASA - BP more improved on Toprol XL 50mg  daily - continue Imdur 30mg  daily  # HTN - Blood pressure better today on Toprol XL alone.  - On amlodipine at home but currently being held.  Consider converting to ARB if BP will tolerate.     # Combined Systolic and Diastolic HF of Unknown Chronicity # Ischemic cardiomyopathy  - Not currently on diuretic, but no signs of significant volume overload on current exam. - Watch BP today given  change to Toprol XL.  If BP does okay will plan to add  spironolactone tomorrow with PRN Lasix at discharge for weight gain greater than 3 pounds in 24 hours or 5 pounds in 1 week. - Continue beta blocker - Consider converting amlodipine to ARB if BP tolerates  # Acute hypoxic respiratory failure secondary to COPD exacerbation - management per primary service  # Chronic Kidney Disease - admitted at 1.9 with no prior baseline comparison - 1.4 this morning post-cath   Signed, Jule Ser, DO  02/15/2017, 1:05 PM      I have  seen, examined and evaluated the patient this PM along with Dr. Juleen China, (R2).  After reviewing all the available data and chart, we discussed the patients laboratory, study & physical findings as well as symptoms in detail. I agree with his findings, examination as well as impression recommendations as per our discussion.    Attending adjustments noted in italics.   Karen is doing relatively well today. He was resting comfortably and walked in. No notable change on exam. Agree with the exam as described above.  Prior to going into see the patient as Dr. Juleen China indicated be spent close to an hour combined talking first with the daughter while the patient was sleeping about the findings and the cath and potential options going forward. We indicated that he is unequivocally not at candidate for PCI given the severity of his disease with aneurysmal dilation etc. Also the presence of femoral arterial aneurysms would preclude the use of any support device such as Impella. Also probably with his underlying comorbidities including severe COPD on oxygen and essentially failure to thrive malnutrition, he probably would not be considered a surgical candidate either. We have asked CT surgery to consult on the patient in order to discuss the risks involved with surgery.  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 With that in mind, I suspect once he hears but bypass surgery entails, he probably would not consider that a viable option. When I asked him directly, he did not indicate warm where another, but my impression was he probably would not want to proceed. We then talked first with the daughter and then him in detail about plans going forward that essentially we switch gears to a more palliative minded therapy. We will adjust his antianginal and heart failure regimen.  He seemed to be relatively receptive of this.  I also then broached the concept of a palliative care consult tach, dyspnea different perspective in order to determine potential options such as home hospice etc. We did discuss this recommendation with the hospice team who agreed to this is appropriate and we'll plan to see the patient during his stay.  The current medication recommendations noted above are based directly on my conversations with Dr. Juleen China and the patient along with his daughter.  Total time spent with the patient and daughter between Dr. Juleen China and myself well -- 90 minutes.  90% of which was in direct counseling and discussion as well as consoling his daughter (POA).   Glenetta Hew, M.D., M.S. Interventional Cardiologist   Pager # [phone] Phone # [phone] [address] [place] [address] [place], [place] [address]

## 2017-02-15 NOTE — Evaluation (Signed)
Physical Therapy Evaluation Patient Details Name: Craig Berry MRN: [id number] DOB: 07-11-1926 Today's Date: 02/15/2017   History of Present Illness  83 y.o. male with CAD, severe COPD, HTN, ongoing tobacco abuse here with acute hypoxic respiratory failure and elevated troponin along with what appears to be ischemic cardiomyopathy with EF of 35-40% with anterior hypo-/akinesis  Clinical Impression  Orders received for PT evaluation. Patient demonstrates deficits in functional mobility as indicated below. Will benefit from continued skilled PT to address deficits and maximize function. Will see as indicated and progress as tolerated.    OF NOTE: patient very unsteady with ambulation, min assist throughout with multiple LOB. Patient with decreased activity tolerance, ambulated on 4 liters with saturations dropoping to 87% and DOE 3/4. increased O2 to 6 liters and reqired 3 minutes to rebound >94%.    Follow Up Recommendations SNF;Supervision for mobility/OOB    Equipment Recommendations   (4 wheeled walker)    Recommendations for Other Services       Precautions / Restrictions Precautions Precautions: Fall Precaution Comments: O2 dependent at baseline Restrictions Weight Bearing Restrictions: No      Mobility  Bed Mobility Overal bed mobility: Needs Assistance Bed Mobility: Supine to Sit;Sit to Supine     Supine to sit: Min guard Sit to supine: Min guard   General bed mobility comments: min guard for safety and positioning  Transfers Overall transfer level: Needs assistance Equipment used: 1 person hand held assist Transfers: Sit to/from Stand Sit to Stand: Min assist         General transfer comment: min assist for stability, patient able to power up to stnading but shows significant instability in stance with posterior bias. Assist required  Ambulation/Gait Ambulation/Gait assistance: Min assist;Mod assist Ambulation Distance (Feet): 70 Feet Assistive device:  1 person hand held assist;Rolling walker (2 wheeled) Gait Pattern/deviations: Step-through pattern;Decreased stride length;Shuffle;Staggering left;Staggering right Gait velocity: decreased   General Gait Details: patient very unsteady with ambulation, min assist throughout with multiple LOB, increased from HHA to RW for support and energy conservation. Patient with decreased activity tolerance, ambulated on 4 liters with saturations dropoping to 87% and DOE 3/4. increased O2 to 6 liters and reqired 3 minutes to rebound >94%  Stairs            Wheelchair Mobility    Modified Rankin (Stroke Patients Only)       Balance Overall balance assessment: Needs assistance   Sitting balance-Leahy Scale: Fair       Standing balance-Leahy Scale: Poor Standing balance comment: instability noted with static activity requiring assist                             Pertinent Vitals/Pain Pain Assessment: No/denies pain    Home Living Family/patient expects to be discharged to:: Private residence (if able) Living Arrangements: Alone (daughter lives next door) Available Help at Discharge: Family Type of Home: House Home Access: Stairs to enter   Technical brewer of Steps: 1 Home Layout: One level Home Equipment: Cane - single point      Prior Function Level of Independence: Independent with assistive device(s)         Comments: ocassional use of Cane, recent difficulty with ADLs     Hand Dominance   Dominant Hand: Right    Extremity/Trunk Assessment   Upper Extremity Assessment Upper Extremity Assessment: Generalized weakness    Lower Extremity Assessment Lower Extremity Assessment: Generalized weakness  Communication   Communication: HOH  Cognition Arousal/Alertness: Awake/alert Behavior During Therapy: WFL for tasks assessed/performed Overall Cognitive Status: Within Functional Limits for tasks assessed                                         General Comments      Exercises     Assessment/Plan    PT Assessment Patient needs continued PT services  PT Problem List Decreased strength;Decreased activity tolerance;Decreased balance;Decreased mobility;Cardiopulmonary status limiting activity       PT Treatment Interventions DME instruction;Gait training;Functional mobility training;Therapeutic activities;Therapeutic exercise;Balance training;Patient/family education    PT Goals (Current goals can be found in the Care Plan section)  Acute Rehab PT Goals PT Goal Formulation: With patient/family Time For Goal Achievement: 03/01/17 Potential to Achieve Goals: Good    Frequency Min 2X/week   Barriers to discharge        Co-evaluation               AM-PAC PT "6 Clicks" Daily Activity  Outcome Measure Difficulty turning over in bed (including adjusting bedclothes, sheets and blankets)?: A Lot Difficulty moving from lying on back to sitting on the side of the bed? : A Lot Difficulty sitting down on and standing up from a chair with arms (e.g., wheelchair, bedside commode, etc,.)?: Total Help needed moving to and from a bed to chair (including a wheelchair)?: A Lot Help needed walking in hospital room?: A Lot Help needed climbing 3-5 steps with a railing? : A Lot 6 Click Score: 11    End of Session Equipment Utilized During Treatment: Gait belt;Oxygen Activity Tolerance: Patient tolerated treatment well;Patient limited by fatigue Patient left: in bed;with call bell/phone within reach;with family/visitor present Nurse Communication: Mobility status PT Visit Diagnosis: Unsteadiness on feet (R26.81);Muscle weakness (generalized) (M62.81)    Time: 5974-1638 PT Time Calculation (min) (ACUTE ONLY): 31 min   Charges:   PT Evaluation $PT Eval Moderate Complexity: 1 Procedure PT Treatments $Therapeutic Activity: 8-22 mins   PT G Codes:        Alben Deeds, PT DPT  [phone]   Duncan Dull 02/15/2017, 9:05 AM

## 2017-02-15 NOTE — Progress Notes (Addendum)
ANTICOAGULATION CONSULT NOTE - Follow Up Consult  Pharmacy Consult for Heparin  Indication: chest pain/ACS  No Known Allergies  Patient Measurements: Height: 5\' 11"  (180.3 cm) Weight: 116 lb 6.4 oz (52.8 kg) IBW/kg (Calculated) : 75.3  Vital Signs: Temp: 99 F (37.2 C) (05/16 1128) Temp Source: Oral (05/16 1128) BP: 137/80 (05/16 1128) Pulse Rate: 66 (05/16 1128)  Labs:  Recent Labs  02/13/17 0053  02/13/17 0600 02/13/17 1543 02/14/17 0419 02/15/17 0534 02/15/17 0941  HGB  --   --  11.4*  --  12.1* 10.9*  --   HCT  --   --  35.7*  --  37.7* 34.5*  --   PLT  --   --  405*  --  463* 408*  --   LABPROT  --   --   --   --  13.8  --   --   INR  --   --   --   --  1.06  --   --   HEPARINUNFRC  --   < > 0.24* 0.28* 0.64  --  0.46  CREATININE  --   --  1.55*  --  1.63* 1.48*  --   TROPONINI 1.99*  --  1.56* 1.07*  --   --   --   < > = values in this interval not displayed.  Estimated Creatinine Clearance: 25.8 mL/min (A) (by C-G formula based on SCr of 1.48 mg/dL (H)).  Assessment: 83 y/o male on heparin drip for NSTEMI, s/p cath with severe CAD, awaiting TCTS consult but likely not a surgical candidate. Heparin was resumed post-cath.  Heparin level is therapeutic at 0.46. No bleeding noted, Hgb decreased to 10.9 post-cath, platelets are normal.  Goal of Therapy:  Heparin level 0.3-0.7 units/ml Monitor platelets by anticoagulation protocol: Yes   Plan:  Continue heparin drip at 850 units/hr Daily heparin level and CBC Monitor for s/sx of bleeding Consider stopping Levaquin after tomorrow night's dose   Renold Genta, PharmD, BCPS Clinical Pharmacist Phone for today - [facility] - [phone] 02/15/2017 11:34 AM

## 2017-02-15 NOTE — Progress Notes (Signed)
CARDIAC REHAB PHASE I   PRE:  Rate/Rhythm: 66 SR  BP:  Sitting: 142/89        SaO2: 99 4L  MODE:  Ambulation: 70 ft   POST:  Rate/Rhythm: 84 SR  BP:  Sitting: 154/86         SaO2: 87 4L, 90-92 on 4L after 2 minutes rest  Pt in bed, agreeable to walk. Pt ambulated 70 ft on 4L O2, rolling walker, IV, gait belt, assist x2, slow, moderately unsteady gait, tolerated fair. Pt with significant DOE, fatigue with distance, standing rest x1. Encouraged pursed lip breathing, pt using accessory muscles upon return to room, sats 87% on 4L, improved with rest. Pt to recliner after walk, daughter at bedside, call bell within reach. Will follow as x2.     [id number] Lenna Sciara, RN, BSN 02/15/2017 2:58 PM

## 2017-02-15 NOTE — Progress Notes (Signed)
Tech went in Pt's to get a set of vital signs. While getting vital signs tech offered Pt a bath. Pt stated to tech no and he wanted to go back to sleep.

## 2017-02-15 NOTE — Consult Note (Signed)
Craig Berry       [place] [address]             [phone]        Craig Berry  Medical Record #[id number] Date of Birth: 19-Apr-1926  Referring: Leonie Man, MD Primary Cardiologist:  Craig More, MD Primary Care: Craig Anes, MD  Chief Complaint:    Chief Complaint  Patient presents with  . Shortness of Breath    History of Present Illness:     Craig Berry is an 83 year old male with a past medical history is significant for emphysema/COPD, renal cell carcinoma s/p nephrectomy, coronary artery disease, hypertension, oxygen dependence, tobacco abuse, hyperlipidemia, chronic kidney disease, cerebrovascular disease, peripheral vascular disease, history of syncope and collapse, with a surgical history of abdominal aortic aneurysm repair who presented to [facility] 3 days ago with a complaint of shortness of breath and bilateral arm pain while mowing his lawn. He had previously been treated by his primary care office with antibiotics and steroids. Despite using his home oxygen and prescribed inhalers patient continued to have worsening shortness of breath. On admission he was placed on BiPAP. His oxygen saturations were noted to be in 70s. Associated symptoms include mild constipation, anorexia, and chronic cough. The patient's initial troponin was 1.6. The EKG on admission was insignificant. BNP was noted to be significantly elevated at 653.3. A cardiac cath was obtained on 02/14/2017 which showed severely calcified advanced coronary artery disease. There was a 70-80% distal left main stenosis, segmental 85-95% ostial proximal LAD stenosis, severe ostial to proximal circumflex disease, moderate ramus intermedius, and 70% proximal and 70% mid stenosis in the dominant right coronary artery.  He also received an echocardiogram which showed an estimated ejection fraction of 35-40%. There was mild mitral and tricuspid regurgitation. We are  consulted for possible surgical revascularization. He did receive Plavix and would require a washout period.   The patient lives alone but his daughter lives next door. She comes over from time to time to help him. His wife died 32 years ago and since then he has lived alone. He has one daughter, his son died three years ago of a myocardial infarction. He does use oxygen at home but has never required a walker.     Current Activity/ Functional Status: Patient was independent with mobility/ambulation, transfers, ADL's, IADL's.   Zubrod Score: At the time of surgery this patient's most appropriate activity status/level should be described as: []     0    Normal activity, no symptoms [x]     1    Restricted in physical strenuous activity but ambulatory, able to do out light work []     2    Ambulatory and capable of self care, unable to do work activities, up and about                 Berry than 50%  Of the time                            []     3    Only limited self care, in bed greater than 50% of waking hours []     4    Completely disabled, no self care, confined to bed or chair []     5    Moribund  Past Medical History:  Diagnosis Date  . AAA (abdominal aortic aneurysm) ([place])   .  CAD (coronary artery disease)    NO h/o PCI - chronic stable angina managed medically  . Cerebrovascular disease   . COPD (chronic obstructive pulmonary disease) ([facility])   . Femoral artery aneurysm, right ([facility])   . History of abdominal aortic aneurysm repair   . HTN (hypertension)   . Hyperlipidemia   . PVD (peripheral vascular disease) ([place])   . Renal cell cancer ([facility])    s/p nephrectomy (basline CKD)    Past Surgical History:  Procedure Laterality Date  . ABDOMINAL AORTIC ANEURYSM REPAIR  2000  . LEFT HEART CATH AND CORONARY ANGIOGRAPHY N/A 02/14/2017   Procedure: Left Heart Cath and Coronary Angiography;  Surgeon: Belva Crome, MD;  Location: [facility];  Service: Cardiovascular;  Laterality:  N/A;  . nephrectomy    . s/p nephrectomy      History  Smoking Status  . Current Every Day Smoker  Smokeless Tobacco  . Never Used    History  Alcohol Use No    Social History   Social History  . Marital status: Widowed    Spouse name: N/A  . Number of children: N/A  . Years of education: N/A   Occupational History  . retired    Social History Main Topics  . Smoking status: Current Every Day Smoker  . Smokeless tobacco: Never Used  . Alcohol use No  . Drug use: No  . Sexual activity: Not on file   Other Topics Concern  . Not on file   Social History Narrative  . No narrative on file    No Known Allergies  Current Facility-Administered Medications  Medication Dose Route Frequency Provider Last Rate Last Dose  . 0.9 %  sodium chloride infusion  250 mL Intravenous PRN Belva Crome, MD      . acetaminophen (TYLENOL) tablet 650 mg  650 mg Oral Q6H PRN Norval Morton, MD       Or  . acetaminophen (TYLENOL) suppository 650 mg  650 mg Rectal Q6H PRN Smith, Rondell A, MD      . albuterol (PROVENTIL) (2.5 MG/3ML) 0.083% nebulizer solution 2.5 mg  2.5 mg Nebulization Q4H PRN Smith, Rondell A, MD      . arformoterol (BROVANA) nebulizer solution 15 mcg  15 mcg Nebulization BID Fuller Plan A, MD   15 mcg at 02/15/17 0944  . aspirin chewable tablet 81 mg  81 mg Oral Daily Belva Crome, MD   81 mg at 02/15/17 1023  . atorvastatin (LIPITOR) tablet 80 mg  80 mg Oral QHS Lavina Hamman, MD   80 mg at 02/14/17 2133  . budesonide (PULMICORT) nebulizer solution 0.5 mg  0.5 mg Nebulization BID Fuller Plan A, MD   0.5 mg at 02/15/17 0948  . diphenhydrAMINE (BENADRYL) injection 12.5 mg  12.5 mg Intravenous Q6H PRN Smith, Rondell A, MD      . ipratropium (ATROVENT) nebulizer solution 0.5 mg  0.5 mg Nebulization Q4H PRN Tamala Julian, Rondell A, MD      . isosorbide mononitrate (IMDUR) 24 hr tablet 30 mg  30 mg Oral Daily Jule Ser, DO   30 mg at 02/15/17 1023  . MEDLINE mouth  rinse  15 mL Mouth Rinse BID Lavina Hamman, MD   15 mL at 02/14/17 2200  . methylPREDNISolone sodium succinate (SOLU-MEDROL) 40 mg/mL injection 40 mg  40 mg Intravenous Q12H Hongalgi, Anand D, MD      . metoprolol succinate (TOPROL-XL) 24 hr tablet 50 mg  50 mg Oral Daily Leonie Man, MD   50 mg at 02/15/17 1023  . nicotine (NICODERM CQ - dosed in mg/24 hours) patch 21 mg  21 mg Transdermal Daily Tamala Julian, Rondell A, MD   21 mg at 02/15/17 1024  . ondansetron (ZOFRAN) tablet 4 mg  4 mg Oral Q6H PRN Fuller Plan A, MD       Or  . ondansetron (ZOFRAN) injection 4 mg  4 mg Intravenous Q6H PRN Smith, Rondell A, MD      . sodium chloride flush (NS) 0.9 % injection 3 mL  3 mL Intravenous Q12H Belva Crome, MD      . sodium chloride flush (NS) 0.9 % injection 3 mL  3 mL Intravenous PRN Belva Crome, MD        Prescriptions Prior to Admission  Medication Sig Dispense Refill Last Dose  . albuterol (PROVENTIL HFA;VENTOLIN HFA) 108 (90 Base) MCG/ACT inhaler Inhale 2 puffs into the lungs every 6 (six) hours as needed for wheezing or shortness of breath.   few weeks ago  . aspirin EC 81 MG tablet Take 81 mg by mouth at bedtime.   02/11/2017 at pm  . beta carotene w/minerals (OCUVITE) tablet Take 1 tablet by mouth daily.   02/12/2017 at am  . budesonide-formoterol (SYMBICORT) 160-4.5 MCG/ACT inhaler Inhale 2 puffs into the lungs 2 (two) times daily.   02/12/2017 at am  . clopidogrel (PLAVIX) 75 MG tablet Take 75 mg by mouth at bedtime.   02/11/2017 at pm  . Dextromethorphan-Guaifenesin (CORICIDIN HBP CONGESTION/COUGH) 10-200 MG CAPS Take 1 tablet by mouth daily as needed (cough/congestion).   couple days ago  . fish oil-omega-3 fatty acids 1000 MG capsule Take 1 g by mouth 2 (two) times daily.     02/12/2017 at am  . levofloxacin (LEVAQUIN) 500 MG tablet Take 500 mg by mouth daily. 10 day course started 02/10/17   02/12/2017 at am  . meloxicam (MOBIC) 15 MG tablet Take 15 mg by mouth daily.   02/12/2017 at  am  . Multiple Vitamin (MULTIVITAMIN WITH MINERALS) TABS tablet Take 1 tablet by mouth daily.   02/12/2017 at am  . NORVASC 5 MG tablet TAKE 1&1/2 TABLETS EVERY OTHER DAY. (Patient taking differently: TAKE 1 TABLET BY MOUTH DAILY AT BEDTIME) 45 each 6 02/11/2017 at pm  . Omega-3 Fatty Acids (FISH OIL PO) Take 1,600 mg by mouth 2 (two) times daily.   02/12/2017 at am  . OVER THE COUNTER MEDICATION Take 1 tablet by mouth daily. Hemp gummies for appetite   02/12/2017 at am  . OXYGEN Inhale 2.5 L into the lungs continuous.   02/12/2017 at Unknown time  . predniSONE (DELTASONE) 5 MG tablet Take 5-10 mg by mouth See admin instructions. Tapered course started 02/10/17: Day 1: take 2 tablets (10 mg) by mouth with breakfast, lunch and supper, Day 2: take 2 tablets (10 mg) with breakfast and lunch and 1 tablet (5 mg) with supper Day 3: take 2 tablets (10 mg) with breakfast and 1 tablet (5 mg) with lunch and supper, Day 4: take 1 tablet (5 mg) with breakfast, lunch and supper, Day 5: Take 1 tablet (5 mg) with breakfast and supper, Day 6: take 1 tablet (5 mg) with breakfast, then stop   02/12/2017 at lunch  . PROBIOTIC CAPS Take 1 capsule by mouth daily.     02/12/2017 at am  . ranolazine (RANEXA) 500 MG 12 hr tablet Take 500 mg by mouth  2 (two) times daily.   02/12/2017 at am  . rosuvastatin (CRESTOR) 20 MG tablet Take 20 mg by mouth at bedtime.   02/11/2017 at pm    Family History  Problem Relation Age of Onset  . Heart disease Unknown   . Cancer Mother        deceased  . Heart attack Father        deceased  . Cancer Father        deceased  . Heart attack Brother 62       deceased     Review of Systems:  Pertinent items are noted in HPI.     Cardiac Review of Systems: Y or N  Chest Pain [  N  ]  Resting SOB [ N  ] Exertional SOB  [ Y ]  Orthopnea [  ]   Pedal Edema [  N ]    Palpitations [  ] Syncope  [ Y ]   Presyncope [   ]  General Review of Systems: [Y] = yes [  ]=no Constitional: recent weight  change [ Y ]; anorexia [ Y ]; fatigue [  ]; nausea [ N ]; night sweats [  ]; fever [ N ]; or chills [  ]                                                               Dental: poor dentition[  ]; Last Dentist visit:   Eye : blurred vision [  ]; diplopia [   ]; vision changes [  ];  Amaurosis fugax[  ]; Resp: cough [ Y  ];  wheezing[ Y ];  hemoptysis[  ]; shortness of breath[Y  ]; paroxysmal nocturnal dyspnea[  ]; dyspnea on exertion[ Y ]; or orthopnea[  ];  GI:  gallstones[  ], vomiting[ N ];  dysphagia[  ]; melena[  ];  hematochezia [  ]; heartburn[  ];   Hx of  Colonoscopy[  ]; GU: kidney stones [  ]; hematuria[  ];   dysuria [  ];  nocturia[  ];  history of  obstruction [  ]; urinary frequency [  ]             Skin: rash, swelling[ N ];, hair loss[ Y ];  peripheral edema[ N ];  or itching[  ]; Musculosketetal: myalgias[  ];  joint swelling[N  ];  joint erythema[  ];  joint pain[  ];  back pain[  ];  Heme/Lymph: bruising[ Y ];  bleeding[Y  ];  anemia[  ];  Neuro: TIA[  ];  headaches[ N ];  stroke[ N ];  vertigo[  ];  seizures[  ];   paresthesias[  ];  difficulty walking[  ];  Psych:depression[ N ]; anxiety[ N ];  Endocrine: diabetes[ N ];  thyroid dysfunction[ N ];  Immunizations: Flu [  ]; Pneumococcal[  ];  Other:  Physical Exam: BP 137/80 (BP Location: Left Arm)   Pulse 66   Temp 99 F (37.2 C) (Oral)   Resp (!) 21   Ht 5\' 11"  (1.803 m)   Wt 52.8 kg (116 lb 6.4 oz)   SpO2 94%   BMI 16.23 kg/m    General appearance: alert, cooperative, no distress, pale and malnurished Resp:  bilateral wheezing in upper fields Cardio: regular rate and rhythm, S1, S2 normal, no murmur, click, rub or gallop GI: soft, non-tender; bowel sounds normal; no masses,  no organomegaly Extremities: extremities normal, atraumatic, no cyanosis or edema Neurologic: Grossly normal  Several hernias: largest is in left groin, right groin, and an incisional hernia near the xyphoid process.   Diagnostic Studies &  Laboratory data:  Cardiac Cath:  Severe calcific and far advanced coronary artery disease.  70-80% distal left main.  Segmental 85-95% ostial to proximal LAD.  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 The aneurysm is approximately 6-7 mm in diameter  Severe ostial to proximal circumflex disease.  Moderate ramus intermedius.  70% proximal and 70% mid stenoses in the dominant right coronary.  Normal left ventricular end-diastolic pressure. Ventriculography was not performed because of significant kidney impairment.  Echocardiogram:  Study Conclusions  - Left ventricle: The cavity size was normal. Wall thickness was   normal. Systolic function was moderately reduced. The estimated   ejection fraction was in the range of 35% to 40%. There is   akinesis of the mid-apical anteroseptal, lateral, inferoseptal,   and apical myocardium. There is normal contraction of basal   myocardium. Doppler parameters are consistent with abnormal left   ventricular relaxation (grade 1 diastolic dysfunction). Acoustic   contrast opacification revealed no evidence ofthrombus. - Mitral valve: Calcified annulus. There was mild regurgitation. - Right atrium: The atrium was mildly dilated. - Tricuspid valve: There was mild regurgitation.     Recent Radiology Findings:   No results found.    Recent Lab Findings: Lab Results  Component Value Date   WBC 10.0 02/15/2017   HGB 10.9 (L) 02/15/2017   HCT 34.5 (L) 02/15/2017   PLT 408 (H) 02/15/2017   GLUCOSE 144 (H) 02/15/2017   CHOL 152 02/13/2017   TRIG 71 02/13/2017   HDL 61 02/13/2017   LDLCALC 77 02/13/2017   NA 138 02/15/2017   K 4.6 02/15/2017   CL 107 02/15/2017   CREATININE 1.48 (H) 02/15/2017   BUN 43 (H) 02/15/2017   CO2 25 02/15/2017   INR 1.06 02/14/2017      Assessment / Plan:      Patient is an 83 year old male with complex past medical history including known history  of coronary artery disease, COPD for which the patient is on home oxygen, hypertension, renal cell carcinoma status post nephrectomy, peripheral vascular disease, cerebrovascular disease, chronic kidney disease, and abdominal aortic aneurysm with been followed by Dr. Bettina Gavia for many years.  He has become very frail, experienced loss of appetite, and lost a significant amount of weight over the last few years. He presents with exertional shortness of breath and chest discomfort, mildly elevated serum troponin levels, and mildly elevated BNP. I have reviewed the patient's record, examined him and the bedside, and personally reviewed the patient's transthoracic echocardiogram and diagnostic cardiac catheterization. He has left main coronary artery disease and moderate left ventricular systolic dysfunction. Although this patient has what often might be considered surgical anatomy with regards to his significant coronary artery disease, this elderly patient should not be considered a candidate for surgery under any circumstances because of his severely advanced age, numerous comorbid medical problems, mild dementia, and severely debilitated physical condition.  I discussed this matter at length with the patient and his daughter at the bedside. They both agree implicitly and made it clear that the patient would never have been  interested in considering surgical intervention in the first place. Please call if we can be of further assistance.   I spent in excess of 30 minutes during the conduct of this initial hospital encounter and >50% of this time involved direct face-to-face encounter with the patient for counseling and/or coordination of their care.         Rexene Alberts, MD 02/15/2017 9:00 PM

## 2017-02-15 NOTE — Progress Notes (Addendum)
PROGRESS NOTE   Craig Berry  GQQ:[id number]    DOB: 1926/05/02    DOA: 02/12/2017  PCP: Lillard Anes, MD   I have briefly reviewed patients previous medical records in [facility].  Brief Narrative:  83 year old male, lives alone, IADL, PMH of ongoing tobacco abuse, COPD, chronic respiratory failure on home oxygen 2-2.5 L/m, CAD, HTN, PAD, presented with progressively worsening dyspnea, failed outpatient treatment with levofloxacin and steroids, presented with acute respiratory distress, hypoxia in the 70s or and admitted for acute on chronic hypoxic respiratory failure secondary to COPD exacerbation, decompensated CHF and NSTEMI. S/P cardiac cath 02/14/17 which shows advanced CAD and not a suitable candidate for CABG or PCI. Improving.  Assessment & Plan:   Principal Problem:   NSTEMI (non-ST elevated myocardial infarction) ([place]) Active Problems:   Hyperlipidemia   TOBACCO ABUSE   Coronary atherosclerosis   COPD exacerbation (HCC)   Acute respiratory failure (HCC)   CKD (chronic kidney disease) stage 3, GFR 30-59 ml/min   1. Acute on chronic hypoxic respiratory failure: On home oxygen at 2-2.5 L/m. Hypoxic in the 70s on arrival. Briefly required BiPAP-now off. Acute respiratory failure secondary to COPD exacerbation and acute systolic CHF. Treating underlying causes. Improving. Currently saturating in the high 90s on 4 L/m. Wean oxygen to saturations of 88-92 percent. 2. COPD exacerbation: Treated with oxygen, bronchodilator nebulizations, reduced IV Solu-Medrol to 40 mg every 12 hours, completed 4 days of a renally adjusted levofloxacin on 5/16, and flutter valve. Tobacco cessation counseled. Blood cultures 2: Negative to date. Pro-calcitonin <0.1, levofloxacin discontinued. 3. Acute combined systolic and diastolic CHF/ischemic cardiomyopathy: LVEF 35-40 percent by echo 5/14 with wall motion abnormalities. Clinically euvolemic. Cardiology following. Currently not on  diuretics-cardiology contemplating adding Aldactone at discharge  4. CAD/NSTEMI: Cardiology was consulted. Peak troponin was 1.99 which has trended down. Demand ischemia suspected from acute respiratory failure and COPD exacerbation. Due to elevated troponin, reduced LVEF & wall motion abnormalities, underwent cardiac cath 5/15 which revealed severe multivessel CAD and not suitable candidate for CABG or PCI. Continue statins, beta blockers, aspirin, holding Plavix (cardiology may want to continue since no intervention planned), NTG drip changed to Imdur, remains on heparin drip. 5. Essential hypertension: Controlled. Amlodipine discontinued. Continue beta blockers. 6. Hyperlipidemia: Continue statins. 7. Tobacco abuse: Cessation counseled. Nicotine patch. 8. Weight loss: Recommend outpatient detailed evaluation. 9. Stage III chronic kidney disease: Baseline creatinine not known. Presented with creatinine of 1.89 which is improved to 1.48. 10. Normocytic anemia, chronic:? Related to chronic kidney disease. Outpatient follow-up and evaluation as deemed necessary.   DVT prophylaxis: Remains on IV heparin infusion Code Status: Full Family Communication: Discussed in detail with patient's daughter at bedside. Disposition: DC SNF when medically improved and pending ideology recommendations.   Consultants:  Cardiology  Procedures:  Left heart cath & coronary angiography 02/14/17: Conclusion    Severe calcific and far advanced coronary artery disease.  70-80% distal left main.  Segmental 85-95% ostial to proximal LAD.  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 The aneurysm is approximately 6-7 mm in diameter  Severe ostial to proximal circumflex disease.  Moderate ramus intermedius.  70% proximal and 70% mid stenoses in the dominant right coronary.  Normal left ventricular end-diastolic pressure. Ventriculography was not performed  because of significant kidney impairment.   RECOMMENDATIONS:   Surgical anatomy but not likely a surgical candidate  PCI options are markedly limited due to combination of  abnormalities as noted above including the large saccular aneurysm within the treatment segment   2-D echo 02/13/17: LVEF 35-40 percent. Systolic function was moderately reduced. There is akinesis of the mid-apical anteroseptal, lateral, inferoseptal and apical myocardium. Grade 1 diastolic dysfunction. No evidence of thrombus. Mild MR, TR.  Antimicrobials:  Levofloxacin-discontinued 5/16   Subjective: Overall feels better. Dyspnea improved but not at baseline. Has dyspnea on exertion. No chest pain reported.   ROS: No dizziness, lightheadedness or palpitations reported.  Objective:  Vitals:   02/15/17 0753 02/15/17 0944 02/15/17 0949 02/15/17 1128  BP: (!) 108/49   137/80  Pulse: 68   66  Resp: (!) 24   (!) 21  Temp: 97.7 F (36.5 C)   99 F (37.2 C)  TempSrc: Oral   Oral  SpO2: 100% 98% 98% 94%  Weight:      Height:        Examination:  General exam: Pleasant elderly male lying comfortably propped up in bed. Looks frail. Respiratory system: Distant breath sounds and slightly harsh. Occasional rhonchi posteriorly. Respiratory effort normal. Cardiovascular system: S1 & S2 heard, RRR. No JVD, murmurs, rubs, gallops or clicks. No pedal edema. Telemetry: Sinus rhythm. 7 beat nonsustained SVT. Gastrointestinal system: Abdomen is nondistended, soft and nontender. No organomegaly or masses felt. Normal bowel sounds heard. Central nervous system: Alert and oriented. No focal neurological deficits. Extremities: Symmetric 5 x 5 power. Skin: No rashes, lesions or ulcers Psychiatry: Judgement and insight appear normal. Mood & affect appropriate.     Data Reviewed: I have personally reviewed following labs and imaging studies  CBC:  Recent Labs Lab 02/12/17 2032 02/13/17 0600 02/14/17 0419  02/15/17 0534  WBC 13.0* 8.0 9.4 10.0  NEUTROABS 10.6*  --   --   --   HGB 12.2* 11.4* 12.1* 10.9*  HCT 38.3* 35.7* 37.7* 34.5*  MCV 96.7 96.7 95.7 95.8  PLT 434* 405* 463* 144*   Basic Metabolic Panel:  Recent Labs Lab 02/12/17 2032 02/13/17 0600 02/14/17 0419 02/15/17 0534  NA 136 136 137 138  K 4.9 4.8 4.4 4.6  CL 101 102 105 107  CO2 25 25 24 25   GLUCOSE 143* 140* 153* 144*  BUN 27* 27* 35* 43*  CREATININE 1.89* 1.55* 1.63* 1.48*  CALCIUM 9.8 9.4 9.6 9.1   Coagulation Profile:  Recent Labs Lab 02/14/17 0419  INR 1.06   Cardiac Enzymes:  Recent Labs Lab 02/13/17 0053 02/13/17 0600 02/13/17 1543  TROPONINI 1.99* 1.56* 1.07*     Recent Results (from the past 240 hour(s))  MRSA PCR Screening     Status: None   Collection Time: 02/13/17 12:45 AM  Result Value Ref Range Status   MRSA by PCR NEGATIVE NEGATIVE Final    Comment:        The GeneXpert MRSA Assay (FDA approved for NASAL specimens only), is one component of a comprehensive MRSA colonization surveillance program. It is not intended to diagnose MRSA infection nor to guide or monitor treatment for MRSA infections.   Blood culture (routine x 2)     Status: None (Preliminary result)   Collection Time: 02/13/17 12:53 AM  Result Value Ref Range Status   Specimen Description BLOOD LEFT ANTECUBITAL  Final   Special Requests   Final    BOTTLES DRAWN AEROBIC AND ANAEROBIC Blood Culture adequate volume   Culture NO GROWTH 1 DAY  Final   Report Status PENDING  Incomplete  Blood culture (routine x 2)  Status: None (Preliminary result)   Collection Time: 02/13/17 12:55 AM  Result Value Ref Range Status   Specimen Description BLOOD LEFT HAND  Final   Special Requests   Final    BOTTLES DRAWN AEROBIC AND ANAEROBIC Blood Culture adequate volume   Culture NO GROWTH 1 DAY  Final   Report Status PENDING  Incomplete         Radiology Studies: No results found.      Scheduled Meds: .  arformoterol  15 mcg Nebulization BID  . aspirin  81 mg Oral Daily  . atorvastatin  80 mg Oral QHS  . budesonide (PULMICORT) nebulizer solution  0.5 mg Nebulization BID  . isosorbide mononitrate  30 mg Oral Daily  . [START ON 02/16/2017] levofloxacin  500 mg Oral Q48H  . magnesium hydroxide  30 mL Oral Once  . mouth rinse  15 mL Mouth Rinse BID  . methylPREDNISolone (SOLU-MEDROL) injection  60 mg Intravenous Q8H  . metoprolol succinate  50 mg Oral Daily  . metoprolol tartrate  25 mg Oral BID  . nicotine  21 mg Transdermal Daily  . sodium chloride flush  3 mL Intravenous Q12H   Continuous Infusions: . sodium chloride    . heparin 850 Units/hr (02/15/17 0026)     LOS: 3 days     Damire Remedios, MD, FACP, FHM. [facility] Pager [phone]  If 7PM-7AM, please contact night-coverage [email or web address] Password [email or web address] 02/15/2017, 11:55 AM

## 2017-02-16 LAB — CBC
HCT: 33.7 % — ABNORMAL LOW (ref 39.0–52.0)
Hemoglobin: 11.1 g/dL — ABNORMAL LOW (ref 13.0–17.0)
MCH: 31.4 pg (ref 26.0–34.0)
MCHC: 32.9 g/dL (ref 30.0–36.0)
MCV: 95.5 fL (ref 78.0–100.0)
PLATELETS: 374 10*3/uL (ref 150–400)
RBC: 3.53 MIL/uL — AB (ref 4.22–5.81)
RDW: 14.1 % (ref 11.5–15.5)
WBC: 10.9 10*3/uL — ABNORMAL HIGH (ref 4.0–10.5)

## 2017-02-16 MED ORDER — ISOSORBIDE MONONITRATE ER 30 MG PO TB24: 30.0000 mg | ORAL_TABLET | Freq: Every day | ORAL | 0 refills | Status: AC

## 2017-02-16 MED ORDER — FUROSEMIDE 20 MG PO TABS: 20.0000 mg | ORAL_TABLET | Freq: Every day | ORAL | 0 refills | Status: AC | PRN

## 2017-02-16 MED ORDER — ALBUTEROL SULFATE HFA 108 (90 BASE) MCG/ACT IN AERS: 2.0000 | INHALATION_SPRAY | RESPIRATORY_TRACT | 0 refills | Status: AC | PRN

## 2017-02-16 MED ORDER — AMLODIPINE BESYLATE 2.5 MG PO TABS: 2.5000 mg | ORAL_TABLET | Freq: Every day | ORAL | Status: DC

## 2017-02-16 MED ORDER — NICOTINE 21 MG/24HR TD PT24: 21.0000 mg | MEDICATED_PATCH | Freq: Every day | TRANSDERMAL | 0 refills | Status: AC

## 2017-02-16 MED ORDER — PREDNISONE 10 MG PO TABS: ORAL_TABLET | ORAL | 0 refills | Status: AC

## 2017-02-16 MED ORDER — METOPROLOL SUCCINATE ER 50 MG PO TB24: 50.0000 mg | ORAL_TABLET | Freq: Every day | ORAL | 0 refills | Status: AC

## 2017-02-16 MED ORDER — PREDNISONE 20 MG PO TABS
40.0000 mg | ORAL_TABLET | Freq: Every day | ORAL | Status: DC
  Administered 2017-02-16: 40 mg via ORAL
  Filled 2017-02-16: qty 2

## 2017-02-16 MED ORDER — ATORVASTATIN CALCIUM 80 MG PO TABS: 80.0000 mg | ORAL_TABLET | Freq: Every day | ORAL | 0 refills | Status: AC

## 2017-02-16 MED ORDER — AMLODIPINE BESYLATE 5 MG PO TABS: 2.5000 mg | ORAL_TABLET | Freq: Every day | ORAL | 0 refills | Status: AC

## 2017-02-16 MED ORDER — CLOPIDOGREL BISULFATE 75 MG PO TABS
75.0000 mg | ORAL_TABLET | Freq: Every day | ORAL | Status: DC
  Administered 2017-02-16: 75 mg via ORAL
  Filled 2017-02-16: qty 1

## 2017-02-16 MED ORDER — CLOPIDOGREL BISULFATE 75 MG PO TABS: 75.0000 mg | ORAL_TABLET | Freq: Every day | ORAL | Status: AC

## 2017-02-16 NOTE — Progress Notes (Signed)
CARDIAC REHAB PHASE I   PRE:  Rate/Rhythm: 63 SR  BP:  Supine:   Sitting: 143/83  Standing:    SaO2: 94% 3L  MODE:  Ambulation: 160 ft   POST:  Rate/Rhythm: 85  BP:  Supine:   Sitting: 152/91  Standing:    SaO2: 86% 4L   90% with rest   Back to 3L after rest 1305-1330 Pt walked 160 ft on 4L with gait belt use, rolling walker and asst x 2. Gait fairly steady. Pt stated he felt stronger today and was able to walk farther. Very DOE upon return to room and took a few minutes to recover. Pt then wanted to lie down. Left on 3L. Did not refer to CRP 2 due to poor prognosis and poor tolerance to activity. Going home with Home Health services.   Graylon Good, RN BSN  02/16/2017 1:25 PM

## 2017-02-16 NOTE — Progress Notes (Signed)
Pt being discharged home via wheelchair with family. Pt alert and oriented x4. VSS. Pt c/o no pain at this time. Pt on 3L/[place] with no signs of respiratory distress. Education complete and care plans resolved. IVx2 removed with catheter intact and pt tolerated well. Condom cath also removed with no issues. No further issues at this time. Pt to follow up with PCP. Leanne Chang, RN

## 2017-02-16 NOTE — Discharge Summary (Addendum)
Physician Discharge Summary  Craig Berry IOX:[id number] DOB: 1926/03/19  PCP: Lillard Anes, MD  Admit date: 02/12/2017 Discharge date: 02/16/2017  Recommendations for Outpatient Follow-up:  1. Dr. Reinaldo Meeker, PCP in 4 days with repeat labs (CBC & BMP). Please follow final blood culture results that were sent from the hospital. 2. Dr. Shirlee More, Cardiology in 1 week  Home Health: RN, PT and Nurses Aide Equipment/Devices: Rolling walker    Discharge Condition: Improved and stable.  CODE STATUS: DO NOT RESUSCITATE  Diet recommendation: Heart healthy diet.  Discharge Diagnoses:  Principal Problem:   NSTEMI (non-ST elevated myocardial infarction) ([place]) Active Problems:   Hyperlipidemia   TOBACCO ABUSE   Coronary atherosclerosis   COPD exacerbation (HCC)   Acute respiratory failure (HCC)   CKD (chronic kidney disease) stage 3, GFR 30-59 ml/min   Brief Summary: 83 year old male, lives alone, IADL, PMH of ongoing tobacco abuse, COPD, chronic respiratory failure on home oxygen 2-2.5 L/m, CAD, HTN, PAD, presented with progressively worsening dyspnea, failed outpatient treatment with levofloxacin and steroids, presented with acute respiratory distress, hypoxia in the 70s and admitted for acute on chronic hypoxic respiratory failure secondary to COPD exacerbation, decompensated CHF and NSTEMI.   Assessment & Plan:   1. Acute on chronic hypoxic respiratory failure: On home oxygen at 2-2.5 L/m. Hypoxic in the 70s on arrival. Briefly required BiPAP-now off. Acute respiratory failure secondary to COPD exacerbation and less likely due to decompensated CHF. Treated underlying causes. Acute respiratory failure resolved. Patient will return on prior home dose of oxygen. 2. COPD exacerbation: Treated with oxygen, bronchodilator nebulizations, IV Solu-Medrol, completed 4 days of a renally adjusted levofloxacin on 5/16, and flutter valve. Tobacco cessation counseled. Blood cultures  2: Negative to date. Pro-calcitonin <0.1, levofloxacin discontinued. Transitioned to oral prednisone taper at discharge. 3. Chronic combined systolic and diastolic CHF/ischemic cardiomyopathy: LVEF 35-40 percent by echo 5/14 with wall motion abnormalities. Clinically euvolemic. Cardiology follow-up appreciated and recommend when necessary Lasix at discharge. Did not receive diuretics during this admission. 4. CAD/NSTEMI: Cardiology was consulted. Peak troponin was 1.99 which has trended down. Demand ischemia suspected from acute respiratory failure and COPD exacerbation. Due to elevated troponin, reduced LVEF & wall motion abnormalities, underwent cardiac cath 5/15 which revealed severe multivessel CAD and not suitable candidate for CABG or PCI. Continue statins, beta blockers, aspirin, Plavix, Imdur and Ranexa. Recommend outpatient follow-up with his primary cardiologist. Cardiothoracic surgery evaluated and patient not a candidate for CABG. 5. Essential hypertension: Controlled. Continue amlodipine, Toprol-XL and Imdur. 6. Hyperlipidemia: Continue statins, changed to high-dose statins. 7. Tobacco abuse: Cessation counseled. Nicotine patch. 8. Weight loss: Recommend outpatient detailed evaluation. As per daughter, patient has been gradually losing weight over the last 15 years and may have lost 5 pounds since the beginning of this year. 9. Stage III chronic kidney disease: Baseline creatinine not known. Presented with creatinine of 1.89 which improved to 1.48. Outpatient follow-up. 10. Normocytic anemia, chronic:? Related to chronic kidney disease. Outpatient follow-up and evaluation as deemed necessary. Stable. 11. Adult failure to thrive: Due to severe CAD and not candidate for intervention, overall prognosis poor. Palliative care was consulted. CODE STATUS was changed to DO NOT RESUSCITATE/DO NOT INTUBATE. Patient and family decided to return home with health care for now. Patient absolutely declined  SNF placement. Daughter is well aware of hospice of [facility] and is agreeable to initiation of hospice services at a later time as deemed appropriate.     Consultants:  Cardiology  Procedures:  Left heart cath & coronary angiography 02/14/17: Conclusion    Severe calcific and far advanced coronary artery disease.  70-80% distal left main.  Segmental 85-95% ostial to proximal LAD.  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 The aneurysm is approximately 6-7 mm in diameter  Severe ostial to proximal circumflex disease.  Moderate ramus intermedius.  70% proximal and 70% mid stenoses in the dominant right coronary.  Normal left ventricular end-diastolic pressure. Ventriculography was not performed because of significant kidney impairment.   RECOMMENDATIONS:   Surgical anatomy but not likely a surgical candidate  PCI options are markedly limited due to combination of abnormalities as noted above including the large saccular aneurysm within the treatment segment   2-D echo 02/13/17: LVEF 35-40 percent. Systolic function was moderately reduced. There is akinesis of the mid-apical anteroseptal, lateral, inferoseptal and apical myocardium. Grade 1 diastolic dysfunction. No evidence of thrombus. Mild MR, TR.     Discharge Instructions  Discharge Instructions    (HEART FAILURE PATIENTS) Call MD:  Anytime you have any of the following symptoms: 1) 3 pound weight gain in 24 hours or 5 pounds in 1 week 2) shortness of breath, with or without a dry hacking cough 3) swelling in the hands, feet or stomach 4) if you have to sleep on extra pillows at night in order to breathe.    Complete by:  As directed    Call MD for:  difficulty breathing, headache or visual disturbances    Complete by:  As directed    Call MD for:  extreme fatigue    Complete by:  As directed    Call MD for:  persistant dizziness or  light-headedness    Complete by:  As directed    Call MD for:  severe uncontrolled pain    Complete by:  As directed    Call MD for:  temperature >100.4    Complete by:  As directed    Diet - low sodium heart healthy    Complete by:  As directed    Increase activity slowly    Complete by:  As directed        Medication List    STOP taking these medications   levofloxacin 500 MG tablet Commonly known as:  LEVAQUIN   rosuvastatin 20 MG tablet Commonly known as:  CRESTOR     TAKE these medications   albuterol 108 (90 Base) MCG/ACT inhaler Commonly known as:  PROVENTIL HFA;VENTOLIN HFA Inhale 2 puffs into the lungs every 4 (four) hours as needed for wheezing or shortness of breath. What changed:  when to take this   amLODipine 5 MG tablet Commonly known as:  NORVASC Take 0.5 tablets (2.5 mg total) by mouth daily. What changed:  See the new instructions.   aspirin EC 81 MG tablet Take 81 mg by mouth at bedtime.   atorvastatin 80 MG tablet Commonly known as:  LIPITOR Take 1 tablet (80 mg total) by mouth at bedtime.   beta carotene w/minerals tablet Take 1 tablet by mouth daily.   budesonide-formoterol 160-4.5 MCG/ACT inhaler Commonly known as:  SYMBICORT Inhale 2 puffs into the lungs 2 (two) times daily.   clopidogrel 75 MG tablet Commonly known as:  PLAVIX Take 1 tablet (75 mg total) by mouth daily. Start taking on:  02/17/2017 What changed:  when to take this   CORICIDIN HBP CONGESTION/COUGH 10-200 MG Caps Generic drug:  Dextromethorphan-Guaifenesin Take 1 tablet  by mouth daily as needed (cough/congestion).   FISH OIL PO Take 1,600 mg by mouth 2 (two) times daily.   fish oil-omega-3 fatty acids 1000 MG capsule Take 1 g by mouth 2 (two) times daily.   furosemide 20 MG tablet Commonly known as:  LASIX Take 1 tablet (20 mg total) by mouth daily as needed for fluid or edema. Lasix 20mg  for weight gain greater than 3 pounds in 24 hours or 5 pounds in 1 week or  HF symptoms (edema, orthopnea, etc)   isosorbide mononitrate 30 MG 24 hr tablet Commonly known as:  IMDUR Take 1 tablet (30 mg total) by mouth daily. Start taking on:  02/17/2017   meloxicam 15 MG tablet Commonly known as:  MOBIC Take 15 mg by mouth daily.   metoprolol succinate 50 MG 24 hr tablet Commonly known as:  TOPROL-XL Take 1 tablet (50 mg total) by mouth daily. Take with or immediately following a meal. Start taking on:  02/17/2017   multivitamin with minerals Tabs tablet Take 1 tablet by mouth daily.   nicotine 21 mg/24hr patch Commonly known as:  NICODERM CQ - dosed in mg/24 hours Place 1 patch (21 mg total) onto the skin daily. Start taking on:  02/17/2017   OVER THE COUNTER MEDICATION Take 1 tablet by mouth daily. Hemp gummies for appetite   OXYGEN Inhale 2.5 L into the lungs continuous.   predniSONE 10 MG tablet Commonly known as:  DELTASONE Take 4 tablets daily for 3 days, then 3 tablets daily for 3 days, then 2 tablets daily for 3 days, then 1 tablet daily for 3 days,then stop. Start taking on:  02/17/2017 What changed:  medication strength  how much to take  how to take this  when to take this  additional instructions   Probiotic Caps Take 1 capsule by mouth daily.   ranolazine 500 MG 12 hr tablet Commonly known as:  RANEXA Take 500 mg by mouth 2 (two) times daily.      Follow-up Information    Lillard Anes, MD. Schedule an appointment as soon as possible for a visit in 4 day(s).   Specialty:  Family Medicine Why:  To be seen with repeat labs (CBC & BMP). Contact information: [address] [place] [address]        Richardo Priest., MD. Schedule an appointment as soon as possible for a visit in 1 week(s).   Specialty:  Cardiology Contact information: [address] [place] [address] [phone]          No Known Allergies    Procedures/Studies: Dg Chest Port 1 View  Result Date: 02/12/2017 CLINICAL DATA:   83 year old male with acute shortness of breath tonight. EXAM: PORTABLE CHEST 1 VIEW COMPARISON:  02/13/2012 chest CT and prior exams. FINDINGS: Mild cardiomegaly and emphysema again noted. Biapical pleural-parenchymal scarring again noted. There is no evidence of focal airspace disease, pulmonary edema, suspicious pulmonary nodule/mass, pleural effusion, or pneumothorax. No acute bony abnormalities are identified. IMPRESSION: No evidence of acute cardiopulmonary disease. Emphysema and mild cardiomegaly. Electronically Signed   By: Margarette Canada M.D.   On: 02/12/2017 21:11      Subjective: Patient stated that he slept well last night. Denied dyspnea. States that his breathing is back to baseline even with activity. No chest pain reported. No dizziness or lightheadedness.  Discharge Exam:  Vitals:   02/16/17 0755 02/16/17 0912 02/16/17 0950 02/16/17 1225  BP: (!) 159/86  133/75 135/72  Pulse:  63  Resp: (!) 21  18 17   Temp:      TempSrc:      SpO2:  100% 93% 91%  Weight:      Height:        General exam: Pleasant elderly male lying comfortably propped up in bed. Looks frail and chronically ill-looking. Respiratory system: Distant breath sounds but clear to auscultation without wheezing, rhonchi or crackles. Able to speak in full sentences. Respiratory effort normal. Cardiovascular system: S1 & S2 heard, RRR. No JVD, murmurs, rubs, gallops or clicks. No pedal edema. Telemetry: Sinus rhythm.  Gastrointestinal system: Abdomen is nondistended, soft and nontender. No organomegaly or masses felt. Normal bowel sounds heard. Central nervous system: Alert and oriented. No focal neurological deficits. Extremities: Symmetric 5 x 5 power. Skin: No rashes, lesions or ulcers Psychiatry: Judgement and insight appear normal. Mood & affect appropriate.     The results of significant diagnostics from this hospitalization (including imaging, microbiology, ancillary and laboratory) are listed below for  reference.     Microbiology: Recent Results (from the past 240 hour(s))  MRSA PCR Screening     Status: None   Collection Time: 02/13/17 12:45 AM  Result Value Ref Range Status   MRSA by PCR NEGATIVE NEGATIVE Final    Comment:        The GeneXpert MRSA Assay (FDA approved for NASAL specimens only), is one component of a comprehensive MRSA colonization surveillance program. It is not intended to diagnose MRSA infection nor to guide or monitor treatment for MRSA infections.   Blood culture (routine x 2)     Status: None (Preliminary result)   Collection Time: 02/13/17 12:53 AM  Result Value Ref Range Status   Specimen Description BLOOD LEFT ANTECUBITAL  Final   Special Requests   Final    BOTTLES DRAWN AEROBIC AND ANAEROBIC Blood Culture adequate volume   Culture NO GROWTH 3 DAYS  Final   Report Status PENDING  Incomplete  Blood culture (routine x 2)     Status: None (Preliminary result)   Collection Time: 02/13/17 12:55 AM  Result Value Ref Range Status   Specimen Description BLOOD LEFT HAND  Final   Special Requests   Final    BOTTLES DRAWN AEROBIC AND ANAEROBIC Blood Culture adequate volume   Culture NO GROWTH 3 DAYS  Final   Report Status PENDING  Incomplete     Labs: CBC:  Recent Labs Lab 02/12/17 2032 02/13/17 0600 02/14/17 0419 02/15/17 0534 02/16/17 0354  WBC 13.0* 8.0 9.4 10.0 10.9*  NEUTROABS 10.6*  --   --   --   --   HGB 12.2* 11.4* 12.1* 10.9* 11.1*  HCT 38.3* 35.7* 37.7* 34.5* 33.7*  MCV 96.7 96.7 95.7 95.8 95.5  PLT 434* 405* 463* 408* 127   Basic Metabolic Panel:  Recent Labs Lab 02/12/17 2032 02/13/17 0600 02/14/17 0419 02/15/17 0534  NA 136 136 137 138  K 4.9 4.8 4.4 4.6  CL 101 102 105 107  CO2 25 25 24 25   GLUCOSE 143* 140* 153* 144*  BUN 27* 27* 35* 43*  CREATININE 1.89* 1.55* 1.63* 1.48*  CALCIUM 9.8 9.4 9.6 9.1   BNP (last 3 results)  Recent Labs  02/12/17 2035  BNP 635.3*   Cardiac Enzymes:  Recent Labs Lab  02/13/17 0053 02/13/17 0600 02/13/17 1543  TROPONINI 1.99* 1.56* 1.07*   Discussed in detail with patient's daughter. Updated care and answered questions.    Time coordinating discharge: Over 30 minutes  SIGNED:  Vernell Leep, MD, [place], [place]. [facility] Pager [phone]  If 7PM-7AM, please contact night-coverage [email or web address] Password [facility] 02/16/2017, 12:52 PM   Addendum to answer CDI Query  Severe Malnutrition.  Vernell Leep, MD, FACP, FHM. [facility] Pager [phone]  If 7PM-7AM, please contact night-coverage [email or web address] Password [email or web address] 02/20/2017, 3:22 PM

## 2017-02-16 NOTE — Consult Note (Signed)
Consultation Note Date: 02/16/2017   Patient Name: Craig Berry  DOB: Sep 01, 1926  MRN: [id number]  Age / Sex: 83 y.o., male  PCP: Lillard Anes, MD Referring Physician: Modena Jansky, MD  Reason for Consultation: Establishing goals of care  HPI/Patient Profile: 83 y.o. male    admitted on 02/12/2017  Clinical Assessment and Goals of Care:  83 year old male, lives alone, daughter lives next door, PMH of ongoing tobacco abuse, COPD, chronic respiratory failure on home oxygen 2-2.5 L/m, CAD, HTN, PAD, presented with progressively worsening dyspnea, failed outpatient treatment with levofloxacin and steroids, presented with acute respiratory distress, hypoxia in the 70s or and admitted for acute on chronic hypoxic respiratory failure secondary to COPD exacerbation, decompensated CHF and NSTEMI. S/P cardiac cath 02/14/17 which shows advanced CAD and not a suitable candidate for CABG or PCI.  Patient has been seen by cardiology, a palliative consult has been placed for additional goals of care discussions.   The patient is resting in bed, there was a sign in front of the door asking for health care providers to discuss with the daughter first, out side the room. We met with daughter Ms Craig Berry outside the room. She is appreciative of the information that has been provided to her by the hospital medicine and cardiology specialists, she does not want the patient to go to SNF rehab, she states he would be much happier at home, she lives right next door and is involved in the patient's health care.   I introduced myself and palliative care as follows: Palliative medicine is specialized medical care for people living with serious illness. It focuses on providing relief from the symptoms and stress of a serious illness. The goal is to improve quality of life for both the patient and the family.  Daughter is  well aware of hospice and palliative services, they have had close friends and family members pass away recently, having utilized [facility]. We discussed about patient's serious illnesses, both his heart and lungs. Goals and wishes discussed in detail, see recommendations below, thank you for the consult.   NEXT OF KIN  daughter Craig Berry is his next of kin.   SUMMARY OF RECOMMENDATIONS    1. Code status now established as DNR DNI, based on the patient's living will and previously expressed wishes as discussed with daughter.  2. Home with home health care for now, daughter is well aware of [facility] [place] services, she hopes the patient will improve at home with PT to some extent, she is agreeable to initiation of hospice services at a later time.  [place] for D/C home from a palliative standpoint.  Thank you for the consult.   Code Status/Advance Care Planning:  DNR    Symptom Management:    as above   Palliative Prophylaxis:   Delirium Protocol   Psycho-social/Spiritual:   Desire for further Chaplaincy support:no  Additional Recommendations: Education on Hospice  Prognosis:   < 6 months is possible, given the severity  of his multi vessel disease, in addition to his lung disease, how ever, daughter wishes for some home based PT and home nursing care for now, she fully understands that he might need hospice services soon.   Discharge Planning: Home with Home Health for now, daughter will discuss with his PCP about initiating [facility] referral depending on his trajectory after discharge.       Primary Diagnoses: Present on Admission: . Acute respiratory failure ([place]) . Coronary atherosclerosis . COPD exacerbation ([place]) . NSTEMI (non-ST elevated myocardial infarction) ([place]) . CKD (chronic kidney disease) stage 3, GFR 30-59 ml/min . TOBACCO ABUSE . Hyperlipidemia   I have reviewed the medical record, interviewed the  patient and family, and examined the patient. The following aspects are pertinent.  Past Medical History:  Diagnosis Date  . AAA (abdominal aortic aneurysm) ([place])   . CAD (coronary artery disease)    NO h/o PCI - chronic stable angina managed medically  . Cerebrovascular disease   . COPD (chronic obstructive pulmonary disease) ([place])   . Femoral artery aneurysm, right ([place])   . History of abdominal aortic aneurysm repair   . HTN (hypertension)   . Hyperlipidemia   . PVD (peripheral vascular disease) ([place])   . Renal cell cancer [facility])    s/p nephrectomy (basline CKD)   Social History   Social History  . Marital status: Widowed    Spouse name: N/A  . Number of children: N/A  . Years of education: N/A   Occupational History  . retired    Social History Main Topics  . Smoking status: Current Every Day Smoker  . Smokeless tobacco: Never Used  . Alcohol use No  . Drug use: No  . Sexual activity: Not Asked   Other Topics Concern  . None   Social History Narrative  . None   Family History  Problem Relation Age of Onset  . Heart disease Unknown   . Cancer Mother        deceased  . Heart attack Father        deceased  . Cancer Father        deceased  . Heart attack Brother 51       deceased   Scheduled Meds: . arformoterol  15 mcg Nebulization BID  . aspirin  81 mg Oral Daily  . atorvastatin  80 mg Oral QHS  . budesonide (PULMICORT) nebulizer solution  0.5 mg Nebulization BID  . isosorbide mononitrate  30 mg Oral Daily  . mouth rinse  15 mL Mouth Rinse BID  . metoprolol succinate  50 mg Oral Daily  . nicotine  21 mg Transdermal Daily  . predniSONE  40 mg Oral Q breakfast  . sodium chloride flush  3 mL Intravenous Q12H   Continuous Infusions: . sodium chloride     PRN Meds:.sodium chloride, acetaminophen **OR** acetaminophen, albuterol, diphenhydrAMINE, ipratropium, ondansetron **OR** ondansetron (ZOFRAN) IV, sodium chloride flush Medications Prior to  Admission:  Prior to Admission medications   Medication Sig Start Date End Date Taking? Authorizing Provider  albuterol (PROVENTIL HFA;VENTOLIN HFA) 108 (90 Base) MCG/ACT inhaler Inhale 2 puffs into the lungs every 6 (six) hours as needed for wheezing or shortness of breath.   Yes [provider]  aspirin EC 81 MG tablet Take 81 mg by mouth at bedtime.   Yes [provider]  beta carotene w/minerals (OCUVITE) tablet Take 1 tablet by mouth daily.   Yes [provider]  budesonide-formoterol (SYMBICORT)  160-4.5 MCG/ACT inhaler Inhale 2 puffs into the lungs 2 (two) times daily.   Yes [provider]  clopidogrel (PLAVIX) 75 MG tablet Take 75 mg by mouth at bedtime. 11/18/16  Yes [provider]  Dextromethorphan-Guaifenesin (CORICIDIN HBP CONGESTION/COUGH) 10-200 MG CAPS Take 1 tablet by mouth daily as needed (cough/congestion).   Yes [provider]  fish oil-omega-3 fatty acids 1000 MG capsule Take 1 g by mouth 2 (two) times daily.     Yes [provider]  levofloxacin (LEVAQUIN) 500 MG tablet Take 500 mg by mouth daily. 10 day course started 02/10/17 02/10/17  Yes [provider]  meloxicam (MOBIC) 15 MG tablet Take 15 mg by mouth daily. 01/16/17  Yes [provider]  Multiple Vitamin (MULTIVITAMIN WITH MINERALS) TABS tablet Take 1 tablet by mouth daily.   Yes [provider]  NORVASC 5 MG tablet TAKE 1&1/2 TABLETS EVERY OTHER DAY. Patient taking differently: TAKE 1 TABLET BY MOUTH DAILY AT BEDTIME 02/14/11  Yes Hillary Bow, MD  Omega-3 Fatty Acids (FISH OIL PO) Take 1,600 mg by mouth 2 (two) times daily.   Yes [provider]  OVER THE COUNTER MEDICATION Take 1 tablet by mouth daily. Hemp gummies for appetite   Yes [provider]  OXYGEN Inhale 2.5 L into the lungs continuous.   Yes [provider]  predniSONE (DELTASONE) 5 MG tablet Take 5-10 mg by mouth See admin instructions.  Tapered course started 02/10/17: Day 1: take 2 tablets (10 mg) by mouth with breakfast, lunch and supper, Day 2: take 2 tablets (10 mg) with breakfast and lunch and 1 tablet (5 mg) with supper Day 3: take 2 tablets (10 mg) with breakfast and 1 tablet (5 mg) with lunch and supper, Day 4: take 1 tablet (5 mg) with breakfast, lunch and supper, Day 5: Take 1 tablet (5 mg) with breakfast and supper, Day 6: take 1 tablet (5 mg) with breakfast, then stop 02/10/17  Yes [provider]  PROBIOTIC CAPS Take 1 capsule by mouth daily.     Yes [provider]  ranolazine (RANEXA) 500 MG 12 hr tablet Take 500 mg by mouth 2 (two) times daily.   Yes [provider]  rosuvastatin (CRESTOR) 20 MG tablet Take 20 mg by mouth at bedtime. 12/27/16  Yes [provider]   No Known Allergies Review of Systems +some non specific abdominal discomfort Denies chest pain Denies dyspnea  Physical Exam Thin pale elderly gentleman S1 S2 Coarse scattered wheezes Abdomen thin, somewhat distended No edema Has hernias in both groins Awake alert Hard of hearing, but is able to answer all questions appropriately  Vital Signs: BP (!) 145/88 (BP Location: Left Arm)   Pulse 61   Temp 98 F (36.7 C) (Axillary)   Resp 17   Ht _0  (1.803 m)   Wt 52.3 kg (115 lb 3.2 oz)   SpO2 100%   BMI 16.07 kg/m  Pain Assessment: No/denies pain POSS *See Group Information*: 1-Acceptable,Awake and alert Pain Score: 0-No pain   SpO2: SpO2: 100 % O2 Device:SpO2: 100 % O2 Flow Rate: .O2 Flow Rate (L/min): 3 L/min  IO: Intake/output summary:  Intake/Output Summary (Last 24 hours) at 02/16/17 1010 Last data filed at 02/15/17 1723  Gross per 24 hour  Intake              360 ml  Output              200 ml  Net              160 ml    LBM: Last BM Date: 02/15/17 Baseline Weight: Weight: 53.5 kg (118 lb) Most recent weight: Weight: 52.3 kg (115 lb 3.2 oz)     Palliative  Assessment/Data:   Flowsheet Rows     Most Recent Value  Intake Tab  Referral Department  Hospitalist  Unit at Time of Referral  Cardiac/Telemetry Unit  Palliative Care Primary Diagnosis  Cardiac  Palliative Care Type  New Palliative care  Reason for referral  Non-pain Symptom, Clarify Goals of Care, Counsel Regarding Hospice  Date first seen by Palliative Care  02/16/17  Clinical Assessment  Palliative Performance Scale Score  40%  Pain Max last 24 hours  3  Pain Min Last 24 hours  2  Dyspnea Max Last 24 Hours  3  Dyspnea Min Last 24 hours  2  Psychosocial & Spiritual Assessment  Palliative Care Outcomes  Patient/Family meeting held?  Yes  Who was at the meeting?  patient, daughter   Palliative Care Outcomes  Clarified goals of care      Time In:  9 am  Time Out:  10.10 Time Total: 70 min   Greater than 50%  of this time was spent counseling and coordinating care related to the above assessment and plan.  Signed by: Loistine Chance, MD  [phone]  Please contact Palliative Medicine Team phone at [phone] for questions and concerns.  For individual provider: See Shea Evans

## 2017-02-16 NOTE — Care Management Note (Signed)
Case Management Note  Patient Details  Name: Craig Berry MRN: [id number] Date of Birth: 05/07/1926  Subjective/Objective:  Pt presented for chest pain and worsening dyspnea. Pt has 02 via [place]. Pt is from home alone, however daughter will provide supervision at all times.                   Action/Plan: CM did offer agency list and pt chose [name]. Referral was made and SOC to begin within 24-48 hours of d/c. Pt's daughter was able to get DME RW from a family friend. No further needs from CM at this time.  Expected Discharge Date:  02/16/17               Expected Discharge Plan:  [place]  In-House Referral:  NA  Discharge planning Services  CM Consult  Post Acute Care Choice:  Home Health Choice offered to:  Patient, Adult Children  DME Arranged:  N/A DME Agency:  NA  HH Arranged:  RN, PT, Nurse's Aide [place] Agency:  [facility]  Status of Service:  Completed, signed off  If discussed at [place] of Stay Meetings, dates discussed:    Additional Comments:  Bethena Roys, RN 02/16/2017, 2:27 PM

## 2017-02-16 NOTE — Discharge Instructions (Signed)

## 2017-02-16 NOTE — Care Management Important Message (Signed)
Important Message  Patient Details  Name: Craig Berry MRN: [id number] Date of Birth: [birth year]   Medicare Important Message Given:  Yes    Nathen May 02/16/2017, 11:23 AM

## 2017-02-16 NOTE — Progress Notes (Signed)
Progress Note  Patient Name: Craig Berry Date of Encounter: 02/16/2017  Primary Cardiologist: Bettina Gavia  Subjective   Patient seen and examined this morning.  Daughter and other family are at bedside.  Daughter reports meeting with CT surgery and palliative care and comfortable with plan.  Patient reports doing well and without current complaints.  Inpatient Medications    Scheduled Meds: . arformoterol  15 mcg Nebulization BID  . aspirin  81 mg Oral Daily  . atorvastatin  80 mg Oral QHS  . budesonide (PULMICORT) nebulizer solution  0.5 mg Nebulization BID  . isosorbide mononitrate  30 mg Oral Daily  . mouth rinse  15 mL Mouth Rinse BID  . metoprolol succinate  50 mg Oral Daily  . nicotine  21 mg Transdermal Daily  . predniSONE  40 mg Oral Q breakfast  . sodium chloride flush  3 mL Intravenous Q12H   Continuous Infusions: . sodium chloride     PRN Meds: sodium chloride, acetaminophen **OR** acetaminophen, albuterol, diphenhydrAMINE, ipratropium, ondansetron **OR** ondansetron (ZOFRAN) IV, sodium chloride flush   Vital Signs    Vitals:   02/16/17 0454 02/16/17 0755 02/16/17 0912 02/16/17 0950  BP: (!) 145/88 (!) 159/86  133/75  Pulse: 61     Resp: 17 (!) 21  18  Temp: 98 F (36.7 C)     TempSrc: Axillary     SpO2: 100%  100% 93%  Weight: 115 lb 3.2 oz (52.3 kg)     Height:        Intake/Output Summary (Last 24 hours) at 02/16/17 1114 Last data filed at 02/16/17 0900  Gross per 24 hour  Intake              480 ml  Output              200 ml  Net              280 ml   Filed Weights   02/14/17 0427 02/15/17 0411 02/16/17 0454  Weight: 110 lb 9.6 oz (50.2 kg) 116 lb 6.4 oz (52.8 kg) 115 lb 3.2 oz (52.3 kg)    ECG    No new EKG  Physical Exam   GEN: No acute distress, thin, frail, chronically ill appearing, on nasal cannula, resting comfortably, sitting up in bed Cardiac: distant heart sounds, RRR, no murmurs Respiratory: No respiratory distress, +  expiratory wheezes MS: No edema Neuro:  Nonfocal  Psych: Normal affect and approrpriate  Labs    Chemistry  Recent Labs Lab 02/13/17 0600 02/14/17 0419 02/15/17 0534  NA 136 137 138  K 4.8 4.4 4.6  CL 102 105 107  CO2 25 24 25   GLUCOSE 140* 153* 144*  BUN 27* 35* 43*  CREATININE 1.55* 1.63* 1.48*  CALCIUM 9.4 9.6 9.1  GFRNONAA 38* 36* 40*  GFRAA 44* 42* 47*  ANIONGAP 9 8 6      Hematology  Recent Labs Lab 02/14/17 0419 02/15/17 0534 02/16/17 0354  WBC 9.4 10.0 10.9*  RBC 3.94* 3.60* 3.53*  HGB 12.1* 10.9* 11.1*  HCT 37.7* 34.5* 33.7*  MCV 95.7 95.8 95.5  MCH 30.7 30.3 31.4  MCHC 32.1 31.6 32.9  RDW 13.8 14.0 14.1  PLT 463* 408* 374    Cardiac Enzymes  Recent Labs Lab 02/13/17 0053 02/13/17 0600 02/13/17 1543  TROPONINI 1.99* 1.56* 1.07*     Recent Labs Lab 02/12/17 2041  TROPIPOC 1.54*     BNP  Recent Labs Lab 02/12/17 2035  BNP 635.3*     DDimer No results for input(s): DDIMER in the last 168 hours.   Radiology    No results found.  Cardiac Studies    Echo 02/13/2017: Normal LV size and thickness. Moderately reduced LV function with EF 35-40%. Mid-apical anteroseptal, lateral, inferoseptal and apical myocardial akinesis." GR 1 DD". No LV thrombus mild MR.    Severe calcific and far advanced coronary artery disease - from left main into mid LAD  70-80% distal left main. (With 40% ostial left main)  Segmental 85-95% ostial to proximal LAD.  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 The aneurysm is approximately 6-7 mm in diameter  Severe ostial to proximal circumflex disease.  Moderate ramus intermedius.  70% proximal and 70% mid stenoses in the dominant right coronary.  Normal left ventricular end-diastolic pressure. Ventriculography was not performed because of significant kidney impairment.  RECOMMENDATIONS:   Surgical anatomy but not likely a surgical  candidate  PCI options are markedly limited due to combination of abnormalities as noted above including the large saccular aneurysm within the treatment segment.   Diagnostic Diagram - what is not shown is the large saccular aneurysm at the bifurcation of D1 and the LAD.     Patient Profile     83 y.o. male with CAD, severe COPD, HTN, ongoing tobacco abuse here with acute hypoxic respiratory failure and elevated troponin along with what appears to be ischemic cardiomyopathy with EF of 35-40% with anterior hypo-/akinesis.  Assessment & Plan    # Severe and calcified coronary artery disease # NSTEMI, type 2.  Probably demand ischemia mediated by primary lung issues. # HLD - he has severe and calcified CAD not amenable to any PCI options.  He was evaluated and turned down by CT surgery not unexpectedly.  He has been seen by Palliative Care this morning with plan to go home with home health care for now and possible initiation of hospice services at later date. - Resume Aspirin and Plavix at discharge. - Continue Statin - continue Toprol XL 50mg  daily - continue Imdur 30mg  daily - restart home Ranexa  # HTN - blood pressure 133/75 this morning.  Currently just on Toprol XL - will resume amlodipine at lower dose of 2.5mg  daily.  Defer conversion to ARB for outpatient follow up.  # Combined Systolic and Diastolic HF of Unknown Chronicity # Ischemic cardiomyopathy  - PRN Lasix 20mg  at discharge for weight gain greater than 3 pounds in 24 hours or 5 pounds in 1 week or HF symptoms (edema, orthopnea, etc) - continue beta blocker - hold off starting ARB  # Acute hypoxic respiratory failure secondary to COPD exacerbation - management per primary service  # Chronic Kidney Disease - admitted at 1.9 with no prior baseline comparison - stable post-cath  This plan has been discussed with attending cardiologist.  Signed, Jule Ser, DO  02/16/2017, 11:14 AM      I have seen,  examined and evaluated the patient this AM along with Dr. Juleen China, (R2).  After reviewing all the available data and chart, we discussed the patients laboratory, study & physical findings as well as symptoms in detail. I agree with his findings, examination as well as impression recommendations as per our discussion.    Attending adjustments noted in italics.   He looks well this AM - no Angina or CHF Sx.   Was seen by CT  Sgx - confirmed no CABG. Seen  by Palliative Care Team - assisting with d/c planning & GOC.  Agree that he is OK for d/c -- he will f/u with Dr. Bettina Gavia ([place]).    Glenetta Hew, M.D., M.S. Interventional Cardiologist   Pager # [phone] Phone # [phone] [address] [place], [place] [address]

## 2017-02-17 DIAGNOSIS — Z905 Acquired absence of kidney: Secondary | ICD-10-CM | POA: Diagnosis not present

## 2017-02-17 DIAGNOSIS — Z7982 Long term (current) use of aspirin: Secondary | ICD-10-CM | POA: Diagnosis not present

## 2017-02-17 DIAGNOSIS — I130 Hypertensive heart and chronic kidney disease with heart failure and stage 1 through stage 4 chronic kidney disease, or unspecified chronic kidney disease: Secondary | ICD-10-CM | POA: Diagnosis not present

## 2017-02-17 DIAGNOSIS — N183 Chronic kidney disease, stage 3 (moderate): Secondary | ICD-10-CM | POA: Diagnosis not present

## 2017-02-17 DIAGNOSIS — I255 Ischemic cardiomyopathy: Secondary | ICD-10-CM | POA: Diagnosis not present

## 2017-02-17 DIAGNOSIS — I5040 Unspecified combined systolic (congestive) and diastolic (congestive) heart failure: Secondary | ICD-10-CM | POA: Diagnosis not present

## 2017-02-17 DIAGNOSIS — I679 Cerebrovascular disease, unspecified: Secondary | ICD-10-CM | POA: Diagnosis not present

## 2017-02-17 DIAGNOSIS — I2510 Atherosclerotic heart disease of native coronary artery without angina pectoris: Secondary | ICD-10-CM | POA: Diagnosis not present

## 2017-02-17 DIAGNOSIS — Z791 Long term (current) use of non-steroidal anti-inflammatories (NSAID): Secondary | ICD-10-CM | POA: Diagnosis not present

## 2017-02-17 DIAGNOSIS — Z7902 Long term (current) use of antithrombotics/antiplatelets: Secondary | ICD-10-CM | POA: Diagnosis not present

## 2017-02-17 DIAGNOSIS — Z85528 Personal history of other malignant neoplasm of kidney: Secondary | ICD-10-CM | POA: Diagnosis not present

## 2017-02-17 DIAGNOSIS — Z9981 Dependence on supplemental oxygen: Secondary | ICD-10-CM | POA: Diagnosis not present

## 2017-02-17 DIAGNOSIS — J9601 Acute respiratory failure with hypoxia: Secondary | ICD-10-CM | POA: Diagnosis not present

## 2017-02-17 DIAGNOSIS — Z602 Problems related to living alone: Secondary | ICD-10-CM | POA: Diagnosis not present

## 2017-02-17 DIAGNOSIS — I739 Peripheral vascular disease, unspecified: Secondary | ICD-10-CM | POA: Diagnosis not present

## 2017-02-17 DIAGNOSIS — I21A1 Myocardial infarction type 2: Secondary | ICD-10-CM | POA: Diagnosis not present

## 2017-02-17 DIAGNOSIS — J439 Emphysema, unspecified: Secondary | ICD-10-CM | POA: Diagnosis not present

## 2017-02-18 LAB — CULTURE, BLOOD (ROUTINE X 2)
CULTURE: NO GROWTH
Culture: NO GROWTH
SPECIAL REQUESTS: ADEQUATE
Special Requests: ADEQUATE

## 2017-02-19 DIAGNOSIS — Z602 Problems related to living alone: Secondary | ICD-10-CM | POA: Diagnosis not present

## 2017-02-19 DIAGNOSIS — Z7902 Long term (current) use of antithrombotics/antiplatelets: Secondary | ICD-10-CM | POA: Diagnosis not present

## 2017-02-19 DIAGNOSIS — I739 Peripheral vascular disease, unspecified: Secondary | ICD-10-CM | POA: Diagnosis not present

## 2017-02-19 DIAGNOSIS — J439 Emphysema, unspecified: Secondary | ICD-10-CM | POA: Diagnosis not present

## 2017-02-19 DIAGNOSIS — Z7982 Long term (current) use of aspirin: Secondary | ICD-10-CM | POA: Diagnosis not present

## 2017-02-19 DIAGNOSIS — Z9981 Dependence on supplemental oxygen: Secondary | ICD-10-CM | POA: Diagnosis not present

## 2017-02-19 DIAGNOSIS — I5040 Unspecified combined systolic (congestive) and diastolic (congestive) heart failure: Secondary | ICD-10-CM | POA: Diagnosis not present

## 2017-02-19 DIAGNOSIS — I2510 Atherosclerotic heart disease of native coronary artery without angina pectoris: Secondary | ICD-10-CM | POA: Diagnosis not present

## 2017-02-19 DIAGNOSIS — Z85528 Personal history of other malignant neoplasm of kidney: Secondary | ICD-10-CM | POA: Diagnosis not present

## 2017-02-19 DIAGNOSIS — I130 Hypertensive heart and chronic kidney disease with heart failure and stage 1 through stage 4 chronic kidney disease, or unspecified chronic kidney disease: Secondary | ICD-10-CM | POA: Diagnosis not present

## 2017-02-19 DIAGNOSIS — N183 Chronic kidney disease, stage 3 (moderate): Secondary | ICD-10-CM | POA: Diagnosis not present

## 2017-02-19 DIAGNOSIS — J9601 Acute respiratory failure with hypoxia: Secondary | ICD-10-CM | POA: Diagnosis not present

## 2017-02-19 DIAGNOSIS — I255 Ischemic cardiomyopathy: Secondary | ICD-10-CM | POA: Diagnosis not present

## 2017-02-19 DIAGNOSIS — I21A1 Myocardial infarction type 2: Secondary | ICD-10-CM | POA: Diagnosis not present

## 2017-02-19 DIAGNOSIS — Z791 Long term (current) use of non-steroidal anti-inflammatories (NSAID): Secondary | ICD-10-CM | POA: Diagnosis not present

## 2017-02-19 DIAGNOSIS — Z905 Acquired absence of kidney: Secondary | ICD-10-CM | POA: Diagnosis not present

## 2017-02-19 DIAGNOSIS — I679 Cerebrovascular disease, unspecified: Secondary | ICD-10-CM | POA: Diagnosis not present

## 2017-02-22 DIAGNOSIS — I2510 Atherosclerotic heart disease of native coronary artery without angina pectoris: Secondary | ICD-10-CM | POA: Diagnosis not present

## 2017-02-22 DIAGNOSIS — I130 Hypertensive heart and chronic kidney disease with heart failure and stage 1 through stage 4 chronic kidney disease, or unspecified chronic kidney disease: Secondary | ICD-10-CM | POA: Diagnosis not present

## 2017-02-22 DIAGNOSIS — I255 Ischemic cardiomyopathy: Secondary | ICD-10-CM | POA: Diagnosis not present

## 2017-02-22 DIAGNOSIS — Z7982 Long term (current) use of aspirin: Secondary | ICD-10-CM | POA: Diagnosis not present

## 2017-02-22 DIAGNOSIS — J449 Chronic obstructive pulmonary disease, unspecified: Secondary | ICD-10-CM | POA: Diagnosis not present

## 2017-02-22 DIAGNOSIS — Z602 Problems related to living alone: Secondary | ICD-10-CM | POA: Diagnosis not present

## 2017-02-22 DIAGNOSIS — I5040 Unspecified combined systolic (congestive) and diastolic (congestive) heart failure: Secondary | ICD-10-CM | POA: Diagnosis not present

## 2017-02-22 DIAGNOSIS — Z905 Acquired absence of kidney: Secondary | ICD-10-CM | POA: Diagnosis not present

## 2017-02-22 DIAGNOSIS — I679 Cerebrovascular disease, unspecified: Secondary | ICD-10-CM | POA: Diagnosis not present

## 2017-02-22 DIAGNOSIS — Z791 Long term (current) use of non-steroidal anti-inflammatories (NSAID): Secondary | ICD-10-CM | POA: Diagnosis not present

## 2017-02-22 DIAGNOSIS — I21A1 Myocardial infarction type 2: Secondary | ICD-10-CM | POA: Diagnosis not present

## 2017-02-22 DIAGNOSIS — I739 Peripheral vascular disease, unspecified: Secondary | ICD-10-CM | POA: Diagnosis not present

## 2017-02-22 DIAGNOSIS — N183 Chronic kidney disease, stage 3 (moderate): Secondary | ICD-10-CM | POA: Diagnosis not present

## 2017-02-22 DIAGNOSIS — Z9981 Dependence on supplemental oxygen: Secondary | ICD-10-CM | POA: Diagnosis not present

## 2017-02-22 DIAGNOSIS — J9601 Acute respiratory failure with hypoxia: Secondary | ICD-10-CM | POA: Diagnosis not present

## 2017-02-22 DIAGNOSIS — J439 Emphysema, unspecified: Secondary | ICD-10-CM | POA: Diagnosis not present

## 2017-02-22 DIAGNOSIS — Z7902 Long term (current) use of antithrombotics/antiplatelets: Secondary | ICD-10-CM | POA: Diagnosis not present

## 2017-02-22 DIAGNOSIS — Z85528 Personal history of other malignant neoplasm of kidney: Secondary | ICD-10-CM | POA: Diagnosis not present

## 2017-02-23 DIAGNOSIS — I5023 Acute on chronic systolic (congestive) heart failure: Secondary | ICD-10-CM | POA: Diagnosis not present

## 2017-02-23 DIAGNOSIS — J441 Chronic obstructive pulmonary disease with (acute) exacerbation: Secondary | ICD-10-CM | POA: Diagnosis not present

## 2017-02-24 DIAGNOSIS — Z602 Problems related to living alone: Secondary | ICD-10-CM | POA: Diagnosis not present

## 2017-02-24 DIAGNOSIS — N183 Chronic kidney disease, stage 3 (moderate): Secondary | ICD-10-CM | POA: Diagnosis not present

## 2017-02-24 DIAGNOSIS — I679 Cerebrovascular disease, unspecified: Secondary | ICD-10-CM | POA: Diagnosis not present

## 2017-02-24 DIAGNOSIS — Z905 Acquired absence of kidney: Secondary | ICD-10-CM | POA: Diagnosis not present

## 2017-02-24 DIAGNOSIS — I130 Hypertensive heart and chronic kidney disease with heart failure and stage 1 through stage 4 chronic kidney disease, or unspecified chronic kidney disease: Secondary | ICD-10-CM | POA: Diagnosis not present

## 2017-02-24 DIAGNOSIS — I739 Peripheral vascular disease, unspecified: Secondary | ICD-10-CM | POA: Diagnosis not present

## 2017-02-24 DIAGNOSIS — J439 Emphysema, unspecified: Secondary | ICD-10-CM | POA: Diagnosis not present

## 2017-02-24 DIAGNOSIS — I21A1 Myocardial infarction type 2: Secondary | ICD-10-CM | POA: Diagnosis not present

## 2017-02-24 DIAGNOSIS — Z9981 Dependence on supplemental oxygen: Secondary | ICD-10-CM | POA: Diagnosis not present

## 2017-02-24 DIAGNOSIS — Z7982 Long term (current) use of aspirin: Secondary | ICD-10-CM | POA: Diagnosis not present

## 2017-02-24 DIAGNOSIS — I2510 Atherosclerotic heart disease of native coronary artery without angina pectoris: Secondary | ICD-10-CM | POA: Diagnosis not present

## 2017-02-24 DIAGNOSIS — Z7902 Long term (current) use of antithrombotics/antiplatelets: Secondary | ICD-10-CM | POA: Diagnosis not present

## 2017-02-24 DIAGNOSIS — J9601 Acute respiratory failure with hypoxia: Secondary | ICD-10-CM | POA: Diagnosis not present

## 2017-02-24 DIAGNOSIS — Z85528 Personal history of other malignant neoplasm of kidney: Secondary | ICD-10-CM | POA: Diagnosis not present

## 2017-02-24 DIAGNOSIS — Z791 Long term (current) use of non-steroidal anti-inflammatories (NSAID): Secondary | ICD-10-CM | POA: Diagnosis not present

## 2017-02-24 DIAGNOSIS — I5040 Unspecified combined systolic (congestive) and diastolic (congestive) heart failure: Secondary | ICD-10-CM | POA: Diagnosis not present

## 2017-02-24 DIAGNOSIS — I255 Ischemic cardiomyopathy: Secondary | ICD-10-CM | POA: Diagnosis not present

## 2017-02-27 DIAGNOSIS — Z7902 Long term (current) use of antithrombotics/antiplatelets: Secondary | ICD-10-CM | POA: Diagnosis not present

## 2017-02-27 DIAGNOSIS — Z791 Long term (current) use of non-steroidal anti-inflammatories (NSAID): Secondary | ICD-10-CM | POA: Diagnosis not present

## 2017-02-27 DIAGNOSIS — Z9981 Dependence on supplemental oxygen: Secondary | ICD-10-CM | POA: Diagnosis not present

## 2017-02-27 DIAGNOSIS — Z85528 Personal history of other malignant neoplasm of kidney: Secondary | ICD-10-CM | POA: Diagnosis not present

## 2017-02-27 DIAGNOSIS — J9601 Acute respiratory failure with hypoxia: Secondary | ICD-10-CM | POA: Diagnosis not present

## 2017-02-27 DIAGNOSIS — Z7982 Long term (current) use of aspirin: Secondary | ICD-10-CM | POA: Diagnosis not present

## 2017-02-27 DIAGNOSIS — I130 Hypertensive heart and chronic kidney disease with heart failure and stage 1 through stage 4 chronic kidney disease, or unspecified chronic kidney disease: Secondary | ICD-10-CM | POA: Diagnosis not present

## 2017-02-27 DIAGNOSIS — I5040 Unspecified combined systolic (congestive) and diastolic (congestive) heart failure: Secondary | ICD-10-CM | POA: Diagnosis not present

## 2017-02-27 DIAGNOSIS — I739 Peripheral vascular disease, unspecified: Secondary | ICD-10-CM | POA: Diagnosis not present

## 2017-02-27 DIAGNOSIS — Z602 Problems related to living alone: Secondary | ICD-10-CM | POA: Diagnosis not present

## 2017-02-27 DIAGNOSIS — I679 Cerebrovascular disease, unspecified: Secondary | ICD-10-CM | POA: Diagnosis not present

## 2017-02-27 DIAGNOSIS — I21A1 Myocardial infarction type 2: Secondary | ICD-10-CM | POA: Diagnosis not present

## 2017-02-27 DIAGNOSIS — J439 Emphysema, unspecified: Secondary | ICD-10-CM | POA: Diagnosis not present

## 2017-02-27 DIAGNOSIS — Z905 Acquired absence of kidney: Secondary | ICD-10-CM | POA: Diagnosis not present

## 2017-02-27 DIAGNOSIS — N183 Chronic kidney disease, stage 3 (moderate): Secondary | ICD-10-CM | POA: Diagnosis not present

## 2017-02-27 DIAGNOSIS — I255 Ischemic cardiomyopathy: Secondary | ICD-10-CM | POA: Diagnosis not present

## 2017-02-27 DIAGNOSIS — I2510 Atherosclerotic heart disease of native coronary artery without angina pectoris: Secondary | ICD-10-CM | POA: Diagnosis not present

## 2017-03-01 DIAGNOSIS — Z9981 Dependence on supplemental oxygen: Secondary | ICD-10-CM | POA: Diagnosis not present

## 2017-03-01 DIAGNOSIS — Z85528 Personal history of other malignant neoplasm of kidney: Secondary | ICD-10-CM | POA: Diagnosis not present

## 2017-03-01 DIAGNOSIS — J439 Emphysema, unspecified: Secondary | ICD-10-CM | POA: Diagnosis not present

## 2017-03-01 DIAGNOSIS — Z905 Acquired absence of kidney: Secondary | ICD-10-CM | POA: Diagnosis not present

## 2017-03-01 DIAGNOSIS — I130 Hypertensive heart and chronic kidney disease with heart failure and stage 1 through stage 4 chronic kidney disease, or unspecified chronic kidney disease: Secondary | ICD-10-CM | POA: Diagnosis not present

## 2017-03-01 DIAGNOSIS — I679 Cerebrovascular disease, unspecified: Secondary | ICD-10-CM | POA: Diagnosis not present

## 2017-03-01 DIAGNOSIS — I255 Ischemic cardiomyopathy: Secondary | ICD-10-CM | POA: Diagnosis not present

## 2017-03-01 DIAGNOSIS — J449 Chronic obstructive pulmonary disease, unspecified: Secondary | ICD-10-CM | POA: Diagnosis not present

## 2017-03-01 DIAGNOSIS — Z7902 Long term (current) use of antithrombotics/antiplatelets: Secondary | ICD-10-CM | POA: Diagnosis not present

## 2017-03-01 DIAGNOSIS — I429 Cardiomyopathy, unspecified: Secondary | ICD-10-CM | POA: Diagnosis not present

## 2017-03-01 DIAGNOSIS — I248 Other forms of acute ischemic heart disease: Secondary | ICD-10-CM | POA: Diagnosis not present

## 2017-03-01 DIAGNOSIS — Z602 Problems related to living alone: Secondary | ICD-10-CM | POA: Diagnosis not present

## 2017-03-01 DIAGNOSIS — I5040 Unspecified combined systolic (congestive) and diastolic (congestive) heart failure: Secondary | ICD-10-CM | POA: Diagnosis not present

## 2017-03-01 DIAGNOSIS — I739 Peripheral vascular disease, unspecified: Secondary | ICD-10-CM | POA: Diagnosis not present

## 2017-03-01 DIAGNOSIS — I21A1 Myocardial infarction type 2: Secondary | ICD-10-CM | POA: Diagnosis not present

## 2017-03-01 DIAGNOSIS — Z791 Long term (current) use of non-steroidal anti-inflammatories (NSAID): Secondary | ICD-10-CM | POA: Diagnosis not present

## 2017-03-01 DIAGNOSIS — Z7982 Long term (current) use of aspirin: Secondary | ICD-10-CM | POA: Diagnosis not present

## 2017-03-01 DIAGNOSIS — I10 Essential (primary) hypertension: Secondary | ICD-10-CM | POA: Diagnosis not present

## 2017-03-01 DIAGNOSIS — J9601 Acute respiratory failure with hypoxia: Secondary | ICD-10-CM | POA: Diagnosis not present

## 2017-03-01 DIAGNOSIS — N183 Chronic kidney disease, stage 3 (moderate): Secondary | ICD-10-CM | POA: Diagnosis not present

## 2017-03-01 DIAGNOSIS — I25118 Atherosclerotic heart disease of native coronary artery with other forms of angina pectoris: Secondary | ICD-10-CM | POA: Diagnosis not present

## 2017-03-01 DIAGNOSIS — I2510 Atherosclerotic heart disease of native coronary artery without angina pectoris: Secondary | ICD-10-CM | POA: Diagnosis not present

## 2017-03-02 DIAGNOSIS — J9601 Acute respiratory failure with hypoxia: Secondary | ICD-10-CM | POA: Diagnosis not present

## 2017-03-02 DIAGNOSIS — I5040 Unspecified combined systolic (congestive) and diastolic (congestive) heart failure: Secondary | ICD-10-CM | POA: Diagnosis not present

## 2017-03-02 DIAGNOSIS — Z9981 Dependence on supplemental oxygen: Secondary | ICD-10-CM | POA: Diagnosis not present

## 2017-03-02 DIAGNOSIS — Z7982 Long term (current) use of aspirin: Secondary | ICD-10-CM | POA: Diagnosis not present

## 2017-03-02 DIAGNOSIS — I21A1 Myocardial infarction type 2: Secondary | ICD-10-CM | POA: Diagnosis not present

## 2017-03-02 DIAGNOSIS — Z905 Acquired absence of kidney: Secondary | ICD-10-CM | POA: Diagnosis not present

## 2017-03-02 DIAGNOSIS — J439 Emphysema, unspecified: Secondary | ICD-10-CM | POA: Diagnosis not present

## 2017-03-02 DIAGNOSIS — Z7902 Long term (current) use of antithrombotics/antiplatelets: Secondary | ICD-10-CM | POA: Diagnosis not present

## 2017-03-02 DIAGNOSIS — Z85528 Personal history of other malignant neoplasm of kidney: Secondary | ICD-10-CM | POA: Diagnosis not present

## 2017-03-02 DIAGNOSIS — I130 Hypertensive heart and chronic kidney disease with heart failure and stage 1 through stage 4 chronic kidney disease, or unspecified chronic kidney disease: Secondary | ICD-10-CM | POA: Diagnosis not present

## 2017-03-02 DIAGNOSIS — I2510 Atherosclerotic heart disease of native coronary artery without angina pectoris: Secondary | ICD-10-CM | POA: Diagnosis not present

## 2017-03-02 DIAGNOSIS — N183 Chronic kidney disease, stage 3 (moderate): Secondary | ICD-10-CM | POA: Diagnosis not present

## 2017-03-02 DIAGNOSIS — I679 Cerebrovascular disease, unspecified: Secondary | ICD-10-CM | POA: Diagnosis not present

## 2017-03-02 DIAGNOSIS — I255 Ischemic cardiomyopathy: Secondary | ICD-10-CM | POA: Diagnosis not present

## 2017-03-02 DIAGNOSIS — Z791 Long term (current) use of non-steroidal anti-inflammatories (NSAID): Secondary | ICD-10-CM | POA: Diagnosis not present

## 2017-03-02 DIAGNOSIS — I739 Peripheral vascular disease, unspecified: Secondary | ICD-10-CM | POA: Diagnosis not present

## 2017-03-02 DIAGNOSIS — Z602 Problems related to living alone: Secondary | ICD-10-CM | POA: Diagnosis not present

## 2017-03-03 DIAGNOSIS — Z7982 Long term (current) use of aspirin: Secondary | ICD-10-CM | POA: Diagnosis not present

## 2017-03-03 DIAGNOSIS — I21A1 Myocardial infarction type 2: Secondary | ICD-10-CM | POA: Diagnosis not present

## 2017-03-03 DIAGNOSIS — I255 Ischemic cardiomyopathy: Secondary | ICD-10-CM | POA: Diagnosis not present

## 2017-03-03 DIAGNOSIS — I5040 Unspecified combined systolic (congestive) and diastolic (congestive) heart failure: Secondary | ICD-10-CM | POA: Diagnosis not present

## 2017-03-03 DIAGNOSIS — I739 Peripheral vascular disease, unspecified: Secondary | ICD-10-CM | POA: Diagnosis not present

## 2017-03-03 DIAGNOSIS — Z7902 Long term (current) use of antithrombotics/antiplatelets: Secondary | ICD-10-CM | POA: Diagnosis not present

## 2017-03-03 DIAGNOSIS — Z9981 Dependence on supplemental oxygen: Secondary | ICD-10-CM | POA: Diagnosis not present

## 2017-03-03 DIAGNOSIS — J9601 Acute respiratory failure with hypoxia: Secondary | ICD-10-CM | POA: Diagnosis not present

## 2017-03-03 DIAGNOSIS — I130 Hypertensive heart and chronic kidney disease with heart failure and stage 1 through stage 4 chronic kidney disease, or unspecified chronic kidney disease: Secondary | ICD-10-CM | POA: Diagnosis not present

## 2017-03-03 DIAGNOSIS — Z791 Long term (current) use of non-steroidal anti-inflammatories (NSAID): Secondary | ICD-10-CM | POA: Diagnosis not present

## 2017-03-03 DIAGNOSIS — Z602 Problems related to living alone: Secondary | ICD-10-CM | POA: Diagnosis not present

## 2017-03-03 DIAGNOSIS — N183 Chronic kidney disease, stage 3 (moderate): Secondary | ICD-10-CM | POA: Diagnosis not present

## 2017-03-03 DIAGNOSIS — Z85528 Personal history of other malignant neoplasm of kidney: Secondary | ICD-10-CM | POA: Diagnosis not present

## 2017-03-03 DIAGNOSIS — I679 Cerebrovascular disease, unspecified: Secondary | ICD-10-CM | POA: Diagnosis not present

## 2017-03-03 DIAGNOSIS — J439 Emphysema, unspecified: Secondary | ICD-10-CM | POA: Diagnosis not present

## 2017-03-03 DIAGNOSIS — I2510 Atherosclerotic heart disease of native coronary artery without angina pectoris: Secondary | ICD-10-CM | POA: Diagnosis not present

## 2017-03-03 DIAGNOSIS — Z905 Acquired absence of kidney: Secondary | ICD-10-CM | POA: Diagnosis not present

## 2017-03-06 DIAGNOSIS — Z905 Acquired absence of kidney: Secondary | ICD-10-CM | POA: Diagnosis not present

## 2017-03-06 DIAGNOSIS — Z85528 Personal history of other malignant neoplasm of kidney: Secondary | ICD-10-CM | POA: Diagnosis not present

## 2017-03-06 DIAGNOSIS — I679 Cerebrovascular disease, unspecified: Secondary | ICD-10-CM | POA: Diagnosis not present

## 2017-03-06 DIAGNOSIS — Z7982 Long term (current) use of aspirin: Secondary | ICD-10-CM | POA: Diagnosis not present

## 2017-03-06 DIAGNOSIS — J9601 Acute respiratory failure with hypoxia: Secondary | ICD-10-CM | POA: Diagnosis not present

## 2017-03-06 DIAGNOSIS — J439 Emphysema, unspecified: Secondary | ICD-10-CM | POA: Diagnosis not present

## 2017-03-06 DIAGNOSIS — I255 Ischemic cardiomyopathy: Secondary | ICD-10-CM | POA: Diagnosis not present

## 2017-03-06 DIAGNOSIS — I2510 Atherosclerotic heart disease of native coronary artery without angina pectoris: Secondary | ICD-10-CM | POA: Diagnosis not present

## 2017-03-06 DIAGNOSIS — I5040 Unspecified combined systolic (congestive) and diastolic (congestive) heart failure: Secondary | ICD-10-CM | POA: Diagnosis not present

## 2017-03-06 DIAGNOSIS — Z7902 Long term (current) use of antithrombotics/antiplatelets: Secondary | ICD-10-CM | POA: Diagnosis not present

## 2017-03-06 DIAGNOSIS — Z602 Problems related to living alone: Secondary | ICD-10-CM | POA: Diagnosis not present

## 2017-03-06 DIAGNOSIS — N183 Chronic kidney disease, stage 3 (moderate): Secondary | ICD-10-CM | POA: Diagnosis not present

## 2017-03-06 DIAGNOSIS — I130 Hypertensive heart and chronic kidney disease with heart failure and stage 1 through stage 4 chronic kidney disease, or unspecified chronic kidney disease: Secondary | ICD-10-CM | POA: Diagnosis not present

## 2017-03-06 DIAGNOSIS — I21A1 Myocardial infarction type 2: Secondary | ICD-10-CM | POA: Diagnosis not present

## 2017-03-06 DIAGNOSIS — Z9981 Dependence on supplemental oxygen: Secondary | ICD-10-CM | POA: Diagnosis not present

## 2017-03-06 DIAGNOSIS — I739 Peripheral vascular disease, unspecified: Secondary | ICD-10-CM | POA: Diagnosis not present

## 2017-03-06 DIAGNOSIS — Z791 Long term (current) use of non-steroidal anti-inflammatories (NSAID): Secondary | ICD-10-CM | POA: Diagnosis not present

## 2017-03-09 DIAGNOSIS — J439 Emphysema, unspecified: Secondary | ICD-10-CM | POA: Diagnosis not present

## 2017-03-09 DIAGNOSIS — I2510 Atherosclerotic heart disease of native coronary artery without angina pectoris: Secondary | ICD-10-CM | POA: Diagnosis not present

## 2017-03-09 DIAGNOSIS — Z85528 Personal history of other malignant neoplasm of kidney: Secondary | ICD-10-CM | POA: Diagnosis not present

## 2017-03-09 DIAGNOSIS — N183 Chronic kidney disease, stage 3 (moderate): Secondary | ICD-10-CM | POA: Diagnosis not present

## 2017-03-09 DIAGNOSIS — I21A1 Myocardial infarction type 2: Secondary | ICD-10-CM | POA: Diagnosis not present

## 2017-03-09 DIAGNOSIS — I130 Hypertensive heart and chronic kidney disease with heart failure and stage 1 through stage 4 chronic kidney disease, or unspecified chronic kidney disease: Secondary | ICD-10-CM | POA: Diagnosis not present

## 2017-03-09 DIAGNOSIS — Z7982 Long term (current) use of aspirin: Secondary | ICD-10-CM | POA: Diagnosis not present

## 2017-03-09 DIAGNOSIS — Z7902 Long term (current) use of antithrombotics/antiplatelets: Secondary | ICD-10-CM | POA: Diagnosis not present

## 2017-03-09 DIAGNOSIS — J9601 Acute respiratory failure with hypoxia: Secondary | ICD-10-CM | POA: Diagnosis not present

## 2017-03-09 DIAGNOSIS — Z905 Acquired absence of kidney: Secondary | ICD-10-CM | POA: Diagnosis not present

## 2017-03-09 DIAGNOSIS — I5040 Unspecified combined systolic (congestive) and diastolic (congestive) heart failure: Secondary | ICD-10-CM | POA: Diagnosis not present

## 2017-03-09 DIAGNOSIS — I739 Peripheral vascular disease, unspecified: Secondary | ICD-10-CM | POA: Diagnosis not present

## 2017-03-09 DIAGNOSIS — I679 Cerebrovascular disease, unspecified: Secondary | ICD-10-CM | POA: Diagnosis not present

## 2017-03-09 DIAGNOSIS — Z602 Problems related to living alone: Secondary | ICD-10-CM | POA: Diagnosis not present

## 2017-03-09 DIAGNOSIS — I255 Ischemic cardiomyopathy: Secondary | ICD-10-CM | POA: Diagnosis not present

## 2017-03-09 DIAGNOSIS — Z9981 Dependence on supplemental oxygen: Secondary | ICD-10-CM | POA: Diagnosis not present

## 2017-03-09 DIAGNOSIS — Z791 Long term (current) use of non-steroidal anti-inflammatories (NSAID): Secondary | ICD-10-CM | POA: Diagnosis not present

## 2017-03-10 DIAGNOSIS — I255 Ischemic cardiomyopathy: Secondary | ICD-10-CM | POA: Diagnosis not present

## 2017-03-10 DIAGNOSIS — J9601 Acute respiratory failure with hypoxia: Secondary | ICD-10-CM | POA: Diagnosis not present

## 2017-03-10 DIAGNOSIS — Z85528 Personal history of other malignant neoplasm of kidney: Secondary | ICD-10-CM | POA: Diagnosis not present

## 2017-03-10 DIAGNOSIS — I130 Hypertensive heart and chronic kidney disease with heart failure and stage 1 through stage 4 chronic kidney disease, or unspecified chronic kidney disease: Secondary | ICD-10-CM | POA: Diagnosis not present

## 2017-03-10 DIAGNOSIS — I739 Peripheral vascular disease, unspecified: Secondary | ICD-10-CM | POA: Diagnosis not present

## 2017-03-10 DIAGNOSIS — Z9981 Dependence on supplemental oxygen: Secondary | ICD-10-CM | POA: Diagnosis not present

## 2017-03-10 DIAGNOSIS — Z905 Acquired absence of kidney: Secondary | ICD-10-CM | POA: Diagnosis not present

## 2017-03-10 DIAGNOSIS — I21A1 Myocardial infarction type 2: Secondary | ICD-10-CM | POA: Diagnosis not present

## 2017-03-10 DIAGNOSIS — N183 Chronic kidney disease, stage 3 (moderate): Secondary | ICD-10-CM | POA: Diagnosis not present

## 2017-03-10 DIAGNOSIS — Z791 Long term (current) use of non-steroidal anti-inflammatories (NSAID): Secondary | ICD-10-CM | POA: Diagnosis not present

## 2017-03-10 DIAGNOSIS — Z7902 Long term (current) use of antithrombotics/antiplatelets: Secondary | ICD-10-CM | POA: Diagnosis not present

## 2017-03-10 DIAGNOSIS — Z7982 Long term (current) use of aspirin: Secondary | ICD-10-CM | POA: Diagnosis not present

## 2017-03-10 DIAGNOSIS — I2510 Atherosclerotic heart disease of native coronary artery without angina pectoris: Secondary | ICD-10-CM | POA: Diagnosis not present

## 2017-03-10 DIAGNOSIS — I5040 Unspecified combined systolic (congestive) and diastolic (congestive) heart failure: Secondary | ICD-10-CM | POA: Diagnosis not present

## 2017-03-10 DIAGNOSIS — I679 Cerebrovascular disease, unspecified: Secondary | ICD-10-CM | POA: Diagnosis not present

## 2017-03-10 DIAGNOSIS — J439 Emphysema, unspecified: Secondary | ICD-10-CM | POA: Diagnosis not present

## 2017-03-10 DIAGNOSIS — Z602 Problems related to living alone: Secondary | ICD-10-CM | POA: Diagnosis not present

## 2017-03-13 DIAGNOSIS — I2510 Atherosclerotic heart disease of native coronary artery without angina pectoris: Secondary | ICD-10-CM | POA: Diagnosis not present

## 2017-03-13 DIAGNOSIS — I5040 Unspecified combined systolic (congestive) and diastolic (congestive) heart failure: Secondary | ICD-10-CM | POA: Diagnosis not present

## 2017-03-13 DIAGNOSIS — Z9981 Dependence on supplemental oxygen: Secondary | ICD-10-CM | POA: Diagnosis not present

## 2017-03-13 DIAGNOSIS — E782 Mixed hyperlipidemia: Secondary | ICD-10-CM | POA: Diagnosis not present

## 2017-03-13 DIAGNOSIS — Z7982 Long term (current) use of aspirin: Secondary | ICD-10-CM | POA: Diagnosis not present

## 2017-03-13 DIAGNOSIS — I130 Hypertensive heart and chronic kidney disease with heart failure and stage 1 through stage 4 chronic kidney disease, or unspecified chronic kidney disease: Secondary | ICD-10-CM | POA: Diagnosis not present

## 2017-03-13 DIAGNOSIS — I255 Ischemic cardiomyopathy: Secondary | ICD-10-CM | POA: Diagnosis not present

## 2017-03-13 DIAGNOSIS — Z905 Acquired absence of kidney: Secondary | ICD-10-CM | POA: Diagnosis not present

## 2017-03-13 DIAGNOSIS — Z791 Long term (current) use of non-steroidal anti-inflammatories (NSAID): Secondary | ICD-10-CM | POA: Diagnosis not present

## 2017-03-13 DIAGNOSIS — Z602 Problems related to living alone: Secondary | ICD-10-CM | POA: Diagnosis not present

## 2017-03-13 DIAGNOSIS — Z7902 Long term (current) use of antithrombotics/antiplatelets: Secondary | ICD-10-CM | POA: Diagnosis not present

## 2017-03-13 DIAGNOSIS — I10 Essential (primary) hypertension: Secondary | ICD-10-CM | POA: Diagnosis not present

## 2017-03-13 DIAGNOSIS — Z85528 Personal history of other malignant neoplasm of kidney: Secondary | ICD-10-CM | POA: Diagnosis not present

## 2017-03-13 DIAGNOSIS — I21A1 Myocardial infarction type 2: Secondary | ICD-10-CM | POA: Diagnosis not present

## 2017-03-13 DIAGNOSIS — I739 Peripheral vascular disease, unspecified: Secondary | ICD-10-CM | POA: Diagnosis not present

## 2017-03-13 DIAGNOSIS — J9601 Acute respiratory failure with hypoxia: Secondary | ICD-10-CM | POA: Diagnosis not present

## 2017-03-13 DIAGNOSIS — I714 Abdominal aortic aneurysm, without rupture: Secondary | ICD-10-CM | POA: Diagnosis not present

## 2017-03-13 DIAGNOSIS — N183 Chronic kidney disease, stage 3 (moderate): Secondary | ICD-10-CM | POA: Diagnosis not present

## 2017-03-13 DIAGNOSIS — J439 Emphysema, unspecified: Secondary | ICD-10-CM | POA: Diagnosis not present

## 2017-03-13 DIAGNOSIS — J449 Chronic obstructive pulmonary disease, unspecified: Secondary | ICD-10-CM | POA: Diagnosis not present

## 2017-03-13 DIAGNOSIS — I679 Cerebrovascular disease, unspecified: Secondary | ICD-10-CM | POA: Diagnosis not present

## 2017-03-15 DIAGNOSIS — I739 Peripheral vascular disease, unspecified: Secondary | ICD-10-CM | POA: Diagnosis not present

## 2017-03-15 DIAGNOSIS — I255 Ischemic cardiomyopathy: Secondary | ICD-10-CM | POA: Diagnosis not present

## 2017-03-15 DIAGNOSIS — Z602 Problems related to living alone: Secondary | ICD-10-CM | POA: Diagnosis not present

## 2017-03-15 DIAGNOSIS — I679 Cerebrovascular disease, unspecified: Secondary | ICD-10-CM | POA: Diagnosis not present

## 2017-03-15 DIAGNOSIS — Z7902 Long term (current) use of antithrombotics/antiplatelets: Secondary | ICD-10-CM | POA: Diagnosis not present

## 2017-03-15 DIAGNOSIS — Z905 Acquired absence of kidney: Secondary | ICD-10-CM | POA: Diagnosis not present

## 2017-03-15 DIAGNOSIS — J9601 Acute respiratory failure with hypoxia: Secondary | ICD-10-CM | POA: Diagnosis not present

## 2017-03-15 DIAGNOSIS — Z791 Long term (current) use of non-steroidal anti-inflammatories (NSAID): Secondary | ICD-10-CM | POA: Diagnosis not present

## 2017-03-15 DIAGNOSIS — I5040 Unspecified combined systolic (congestive) and diastolic (congestive) heart failure: Secondary | ICD-10-CM | POA: Diagnosis not present

## 2017-03-15 DIAGNOSIS — Z9981 Dependence on supplemental oxygen: Secondary | ICD-10-CM | POA: Diagnosis not present

## 2017-03-15 DIAGNOSIS — Z7982 Long term (current) use of aspirin: Secondary | ICD-10-CM | POA: Diagnosis not present

## 2017-03-15 DIAGNOSIS — I2510 Atherosclerotic heart disease of native coronary artery without angina pectoris: Secondary | ICD-10-CM | POA: Diagnosis not present

## 2017-03-15 DIAGNOSIS — Z85528 Personal history of other malignant neoplasm of kidney: Secondary | ICD-10-CM | POA: Diagnosis not present

## 2017-03-15 DIAGNOSIS — J439 Emphysema, unspecified: Secondary | ICD-10-CM | POA: Diagnosis not present

## 2017-03-15 DIAGNOSIS — N183 Chronic kidney disease, stage 3 (moderate): Secondary | ICD-10-CM | POA: Diagnosis not present

## 2017-03-15 DIAGNOSIS — I130 Hypertensive heart and chronic kidney disease with heart failure and stage 1 through stage 4 chronic kidney disease, or unspecified chronic kidney disease: Secondary | ICD-10-CM | POA: Diagnosis not present

## 2017-03-15 DIAGNOSIS — I21A1 Myocardial infarction type 2: Secondary | ICD-10-CM | POA: Diagnosis not present

## 2017-03-16 DIAGNOSIS — Z791 Long term (current) use of non-steroidal anti-inflammatories (NSAID): Secondary | ICD-10-CM | POA: Diagnosis not present

## 2017-03-16 DIAGNOSIS — Z602 Problems related to living alone: Secondary | ICD-10-CM | POA: Diagnosis not present

## 2017-03-16 DIAGNOSIS — I2510 Atherosclerotic heart disease of native coronary artery without angina pectoris: Secondary | ICD-10-CM | POA: Diagnosis not present

## 2017-03-16 DIAGNOSIS — I130 Hypertensive heart and chronic kidney disease with heart failure and stage 1 through stage 4 chronic kidney disease, or unspecified chronic kidney disease: Secondary | ICD-10-CM | POA: Diagnosis not present

## 2017-03-16 DIAGNOSIS — J439 Emphysema, unspecified: Secondary | ICD-10-CM | POA: Diagnosis not present

## 2017-03-16 DIAGNOSIS — I21A1 Myocardial infarction type 2: Secondary | ICD-10-CM | POA: Diagnosis not present

## 2017-03-16 DIAGNOSIS — Z9981 Dependence on supplemental oxygen: Secondary | ICD-10-CM | POA: Diagnosis not present

## 2017-03-16 DIAGNOSIS — Z85528 Personal history of other malignant neoplasm of kidney: Secondary | ICD-10-CM | POA: Diagnosis not present

## 2017-03-16 DIAGNOSIS — N183 Chronic kidney disease, stage 3 (moderate): Secondary | ICD-10-CM | POA: Diagnosis not present

## 2017-03-16 DIAGNOSIS — J9601 Acute respiratory failure with hypoxia: Secondary | ICD-10-CM | POA: Diagnosis not present

## 2017-03-16 DIAGNOSIS — Z7982 Long term (current) use of aspirin: Secondary | ICD-10-CM | POA: Diagnosis not present

## 2017-03-16 DIAGNOSIS — Z7902 Long term (current) use of antithrombotics/antiplatelets: Secondary | ICD-10-CM | POA: Diagnosis not present

## 2017-03-16 DIAGNOSIS — I5040 Unspecified combined systolic (congestive) and diastolic (congestive) heart failure: Secondary | ICD-10-CM | POA: Diagnosis not present

## 2017-03-16 DIAGNOSIS — Z905 Acquired absence of kidney: Secondary | ICD-10-CM | POA: Diagnosis not present

## 2017-03-16 DIAGNOSIS — I679 Cerebrovascular disease, unspecified: Secondary | ICD-10-CM | POA: Diagnosis not present

## 2017-03-16 DIAGNOSIS — I739 Peripheral vascular disease, unspecified: Secondary | ICD-10-CM | POA: Diagnosis not present

## 2017-03-16 DIAGNOSIS — I255 Ischemic cardiomyopathy: Secondary | ICD-10-CM | POA: Diagnosis not present

## 2017-03-17 DIAGNOSIS — E782 Mixed hyperlipidemia: Secondary | ICD-10-CM | POA: Diagnosis not present

## 2017-03-17 DIAGNOSIS — I10 Essential (primary) hypertension: Secondary | ICD-10-CM | POA: Diagnosis not present

## 2017-03-23 DIAGNOSIS — I255 Ischemic cardiomyopathy: Secondary | ICD-10-CM | POA: Diagnosis not present

## 2017-03-23 DIAGNOSIS — Z9981 Dependence on supplemental oxygen: Secondary | ICD-10-CM | POA: Diagnosis not present

## 2017-03-23 DIAGNOSIS — Z7982 Long term (current) use of aspirin: Secondary | ICD-10-CM | POA: Diagnosis not present

## 2017-03-23 DIAGNOSIS — I2510 Atherosclerotic heart disease of native coronary artery without angina pectoris: Secondary | ICD-10-CM | POA: Diagnosis not present

## 2017-03-23 DIAGNOSIS — Z905 Acquired absence of kidney: Secondary | ICD-10-CM | POA: Diagnosis not present

## 2017-03-23 DIAGNOSIS — J439 Emphysema, unspecified: Secondary | ICD-10-CM | POA: Diagnosis not present

## 2017-03-23 DIAGNOSIS — Z791 Long term (current) use of non-steroidal anti-inflammatories (NSAID): Secondary | ICD-10-CM | POA: Diagnosis not present

## 2017-03-23 DIAGNOSIS — J9601 Acute respiratory failure with hypoxia: Secondary | ICD-10-CM | POA: Diagnosis not present

## 2017-03-23 DIAGNOSIS — I5040 Unspecified combined systolic (congestive) and diastolic (congestive) heart failure: Secondary | ICD-10-CM | POA: Diagnosis not present

## 2017-03-23 DIAGNOSIS — I21A1 Myocardial infarction type 2: Secondary | ICD-10-CM | POA: Diagnosis not present

## 2017-03-23 DIAGNOSIS — I130 Hypertensive heart and chronic kidney disease with heart failure and stage 1 through stage 4 chronic kidney disease, or unspecified chronic kidney disease: Secondary | ICD-10-CM | POA: Diagnosis not present

## 2017-03-23 DIAGNOSIS — I739 Peripheral vascular disease, unspecified: Secondary | ICD-10-CM | POA: Diagnosis not present

## 2017-03-23 DIAGNOSIS — I679 Cerebrovascular disease, unspecified: Secondary | ICD-10-CM | POA: Diagnosis not present

## 2017-03-23 DIAGNOSIS — Z602 Problems related to living alone: Secondary | ICD-10-CM | POA: Diagnosis not present

## 2017-03-23 DIAGNOSIS — N183 Chronic kidney disease, stage 3 (moderate): Secondary | ICD-10-CM | POA: Diagnosis not present

## 2017-03-23 DIAGNOSIS — Z7902 Long term (current) use of antithrombotics/antiplatelets: Secondary | ICD-10-CM | POA: Diagnosis not present

## 2017-03-23 DIAGNOSIS — Z85528 Personal history of other malignant neoplasm of kidney: Secondary | ICD-10-CM | POA: Diagnosis not present

## 2017-03-25 DIAGNOSIS — J449 Chronic obstructive pulmonary disease, unspecified: Secondary | ICD-10-CM | POA: Diagnosis not present

## 2017-03-27 DIAGNOSIS — J441 Chronic obstructive pulmonary disease with (acute) exacerbation: Secondary | ICD-10-CM | POA: Diagnosis not present

## 2017-03-27 DIAGNOSIS — I5023 Acute on chronic systolic (congestive) heart failure: Secondary | ICD-10-CM | POA: Diagnosis not present

## 2017-03-29 DIAGNOSIS — J449 Chronic obstructive pulmonary disease, unspecified: Secondary | ICD-10-CM | POA: Diagnosis not present

## 2017-03-29 DIAGNOSIS — M6281 Muscle weakness (generalized): Secondary | ICD-10-CM | POA: Diagnosis not present

## 2017-03-30 DIAGNOSIS — Z7902 Long term (current) use of antithrombotics/antiplatelets: Secondary | ICD-10-CM | POA: Diagnosis not present

## 2017-03-30 DIAGNOSIS — Z9981 Dependence on supplemental oxygen: Secondary | ICD-10-CM | POA: Diagnosis not present

## 2017-03-30 DIAGNOSIS — I21A1 Myocardial infarction type 2: Secondary | ICD-10-CM | POA: Diagnosis not present

## 2017-03-30 DIAGNOSIS — Z602 Problems related to living alone: Secondary | ICD-10-CM | POA: Diagnosis not present

## 2017-03-30 DIAGNOSIS — I255 Ischemic cardiomyopathy: Secondary | ICD-10-CM | POA: Diagnosis not present

## 2017-03-30 DIAGNOSIS — N183 Chronic kidney disease, stage 3 (moderate): Secondary | ICD-10-CM | POA: Diagnosis not present

## 2017-03-30 DIAGNOSIS — J439 Emphysema, unspecified: Secondary | ICD-10-CM | POA: Diagnosis not present

## 2017-03-30 DIAGNOSIS — I739 Peripheral vascular disease, unspecified: Secondary | ICD-10-CM | POA: Diagnosis not present

## 2017-03-30 DIAGNOSIS — Z905 Acquired absence of kidney: Secondary | ICD-10-CM | POA: Diagnosis not present

## 2017-03-30 DIAGNOSIS — I5040 Unspecified combined systolic (congestive) and diastolic (congestive) heart failure: Secondary | ICD-10-CM | POA: Diagnosis not present

## 2017-03-30 DIAGNOSIS — Z791 Long term (current) use of non-steroidal anti-inflammatories (NSAID): Secondary | ICD-10-CM | POA: Diagnosis not present

## 2017-03-30 DIAGNOSIS — Z7982 Long term (current) use of aspirin: Secondary | ICD-10-CM | POA: Diagnosis not present

## 2017-03-30 DIAGNOSIS — I679 Cerebrovascular disease, unspecified: Secondary | ICD-10-CM | POA: Diagnosis not present

## 2017-03-30 DIAGNOSIS — Z85528 Personal history of other malignant neoplasm of kidney: Secondary | ICD-10-CM | POA: Diagnosis not present

## 2017-03-30 DIAGNOSIS — J9601 Acute respiratory failure with hypoxia: Secondary | ICD-10-CM | POA: Diagnosis not present

## 2017-03-30 DIAGNOSIS — I130 Hypertensive heart and chronic kidney disease with heart failure and stage 1 through stage 4 chronic kidney disease, or unspecified chronic kidney disease: Secondary | ICD-10-CM | POA: Diagnosis not present

## 2017-03-30 DIAGNOSIS — I2510 Atherosclerotic heart disease of native coronary artery without angina pectoris: Secondary | ICD-10-CM | POA: Diagnosis not present

## 2017-04-25 DIAGNOSIS — I214 Non-ST elevation (NSTEMI) myocardial infarction: Secondary | ICD-10-CM | POA: Diagnosis not present

## 2017-04-25 DIAGNOSIS — E785 Hyperlipidemia, unspecified: Secondary | ICD-10-CM | POA: Diagnosis not present

## 2017-04-25 DIAGNOSIS — J9601 Acute respiratory failure with hypoxia: Secondary | ICD-10-CM | POA: Diagnosis not present

## 2017-04-25 DIAGNOSIS — I2510 Atherosclerotic heart disease of native coronary artery without angina pectoris: Secondary | ICD-10-CM | POA: Diagnosis not present

## 2017-04-25 DIAGNOSIS — J449 Chronic obstructive pulmonary disease, unspecified: Secondary | ICD-10-CM | POA: Diagnosis not present

## 2017-05-02 ENCOUNTER — Institutional Professional Consult (permissible substitution): Payer: Self-pay | Admitting: Internal Medicine

## 2017-05-03 DEATH — deceased

## 2019-04-07 IMAGING — CR DG CHEST 1V PORT
2 series · 2 of 2 positions shown · non-contrast
Comparison: 02/13/2012 chest CT and prior exams.

CLINICAL DATA: 89-year-old male with acute shortness of breath
tonight.

EXAM:
PORTABLE CHEST 1 VIEW

[AP (1 of 2)]
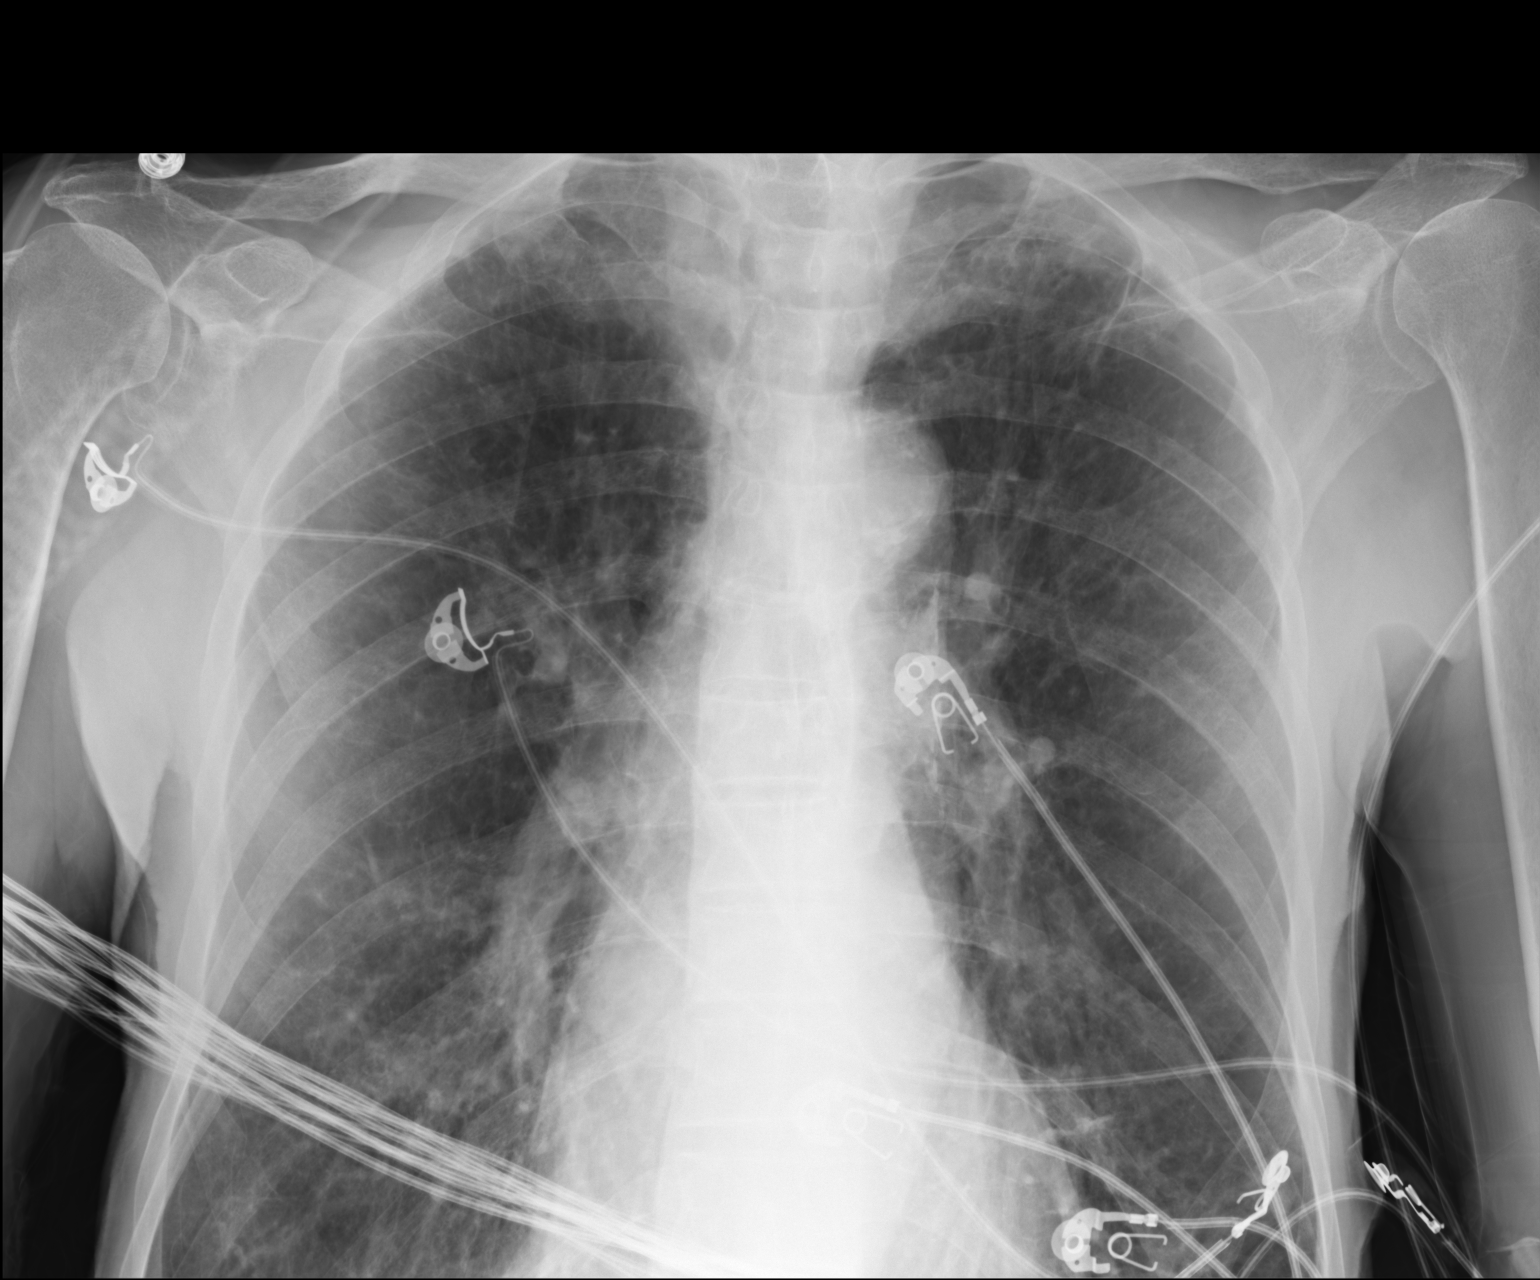

[AP (2 of 2)]
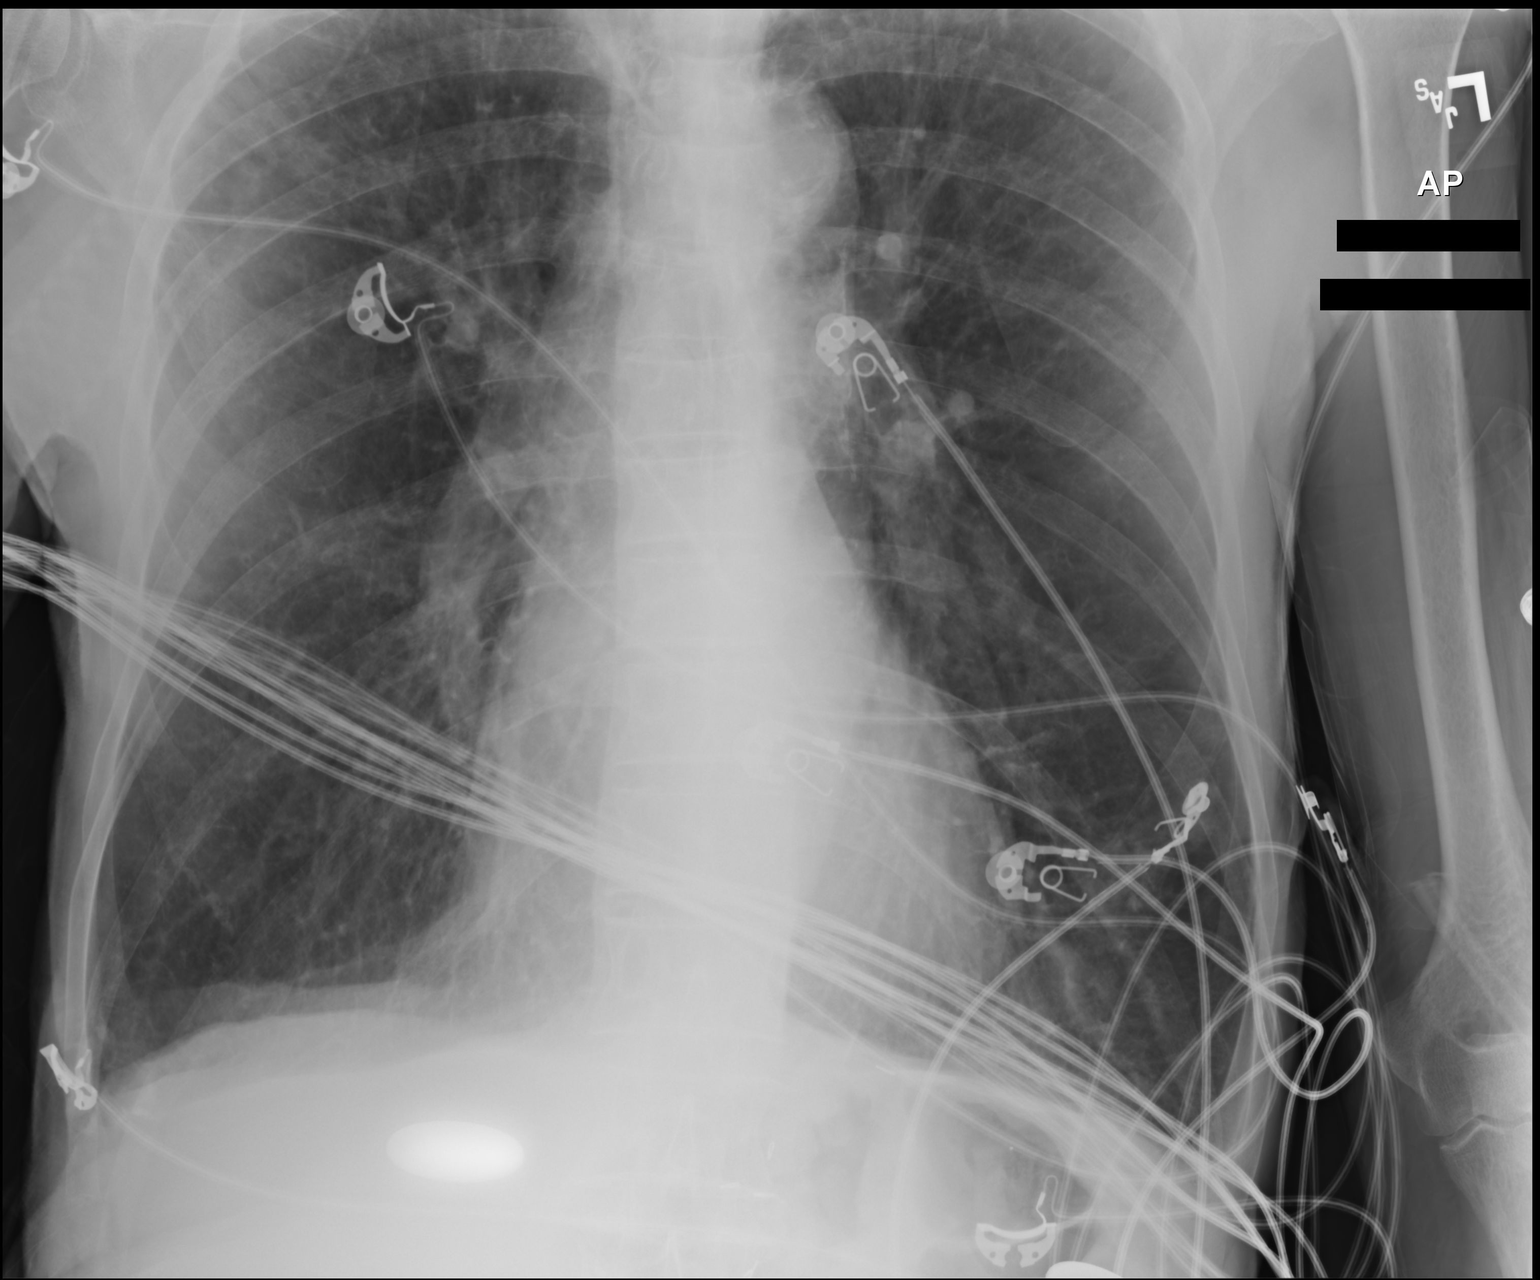

[2 of 2 positions shown; findings below may reference images not displayed]

FINDINGS: Mild cardiomegaly and emphysema again noted.

Biapical pleural-parenchymal scarring again noted.

There is no evidence of focal airspace disease, pulmonary edema,
suspicious pulmonary nodule/mass, pleural effusion, or pneumothorax.

No acute bony abnormalities are identified.
IMPRESSION: No evidence of acute cardiopulmonary disease.

Emphysema and mild cardiomegaly.
